# Patient Record
Sex: Male | Born: 1945 | Race: White | Hispanic: No | Marital: Married | Smoking: Former smoker
Health system: Southern US, Community
[De-identification: ages and names within clinical notes are randomized; demographics above are authoritative.]

## PROBLEM LIST (undated history)

## (undated) DIAGNOSIS — C649 Malignant neoplasm of unspecified kidney, except renal pelvis: Secondary | ICD-10-CM

## (undated) DIAGNOSIS — C61 Malignant neoplasm of prostate: Secondary | ICD-10-CM

## (undated) DIAGNOSIS — N400 Enlarged prostate without lower urinary tract symptoms: Secondary | ICD-10-CM

## (undated) HISTORY — PX: ELBOW SURGERY: SHX618

## (undated) HISTORY — PX: PELVIC FLOOR REPAIR: SHX2192

## (undated) HISTORY — PX: NEPHRECTOMY: SHX65

## (undated) HISTORY — PX: SPLENECTOMY, TOTAL: SHX788

---

## 2002-11-01 ENCOUNTER — Ambulatory Visit (HOSPITAL_COMMUNITY): Admission: RE | Admit: 2002-11-01 | Discharge: 2002-11-01 | Payer: Self-pay | Admitting: Orthopedic Surgery

## 2002-11-01 ENCOUNTER — Encounter: Payer: Self-pay | Admitting: Orthopedic Surgery

## 2004-01-20 ENCOUNTER — Ambulatory Visit (HOSPITAL_COMMUNITY): Admission: RE | Admit: 2004-01-20 | Discharge: 2004-01-20 | Payer: Self-pay | Admitting: Gastroenterology

## 2004-07-05 HISTORY — PX: NEPHRECTOMY RADICAL: SUR878

## 2004-11-02 ENCOUNTER — Ambulatory Visit (HOSPITAL_COMMUNITY): Admission: RE | Admit: 2004-11-02 | Discharge: 2004-11-02 | Payer: Self-pay | Admitting: Urology

## 2004-11-05 ENCOUNTER — Ambulatory Visit (HOSPITAL_COMMUNITY): Admission: RE | Admit: 2004-11-05 | Discharge: 2004-11-05 | Payer: Self-pay | Admitting: Urology

## 2004-11-19 ENCOUNTER — Ambulatory Visit (HOSPITAL_COMMUNITY): Admission: RE | Admit: 2004-11-19 | Discharge: 2004-11-19 | Payer: Self-pay | Admitting: Urology

## 2004-11-19 ENCOUNTER — Ambulatory Visit (HOSPITAL_BASED_OUTPATIENT_CLINIC_OR_DEPARTMENT_OTHER): Admission: RE | Admit: 2004-11-19 | Discharge: 2004-11-19 | Payer: Self-pay | Admitting: Urology

## 2004-11-26 ENCOUNTER — Inpatient Hospital Stay (HOSPITAL_COMMUNITY): Admission: RE | Admit: 2004-11-26 | Discharge: 2004-11-28 | Payer: Self-pay | Admitting: Urology

## 2004-12-03 ENCOUNTER — Inpatient Hospital Stay (HOSPITAL_COMMUNITY): Admission: EM | Admit: 2004-12-03 | Discharge: 2004-12-07 | Payer: Self-pay | Admitting: Emergency Medicine

## 2005-03-15 ENCOUNTER — Encounter: Admission: RE | Admit: 2005-03-15 | Discharge: 2005-03-15 | Payer: Self-pay | Admitting: Family Medicine

## 2005-05-11 ENCOUNTER — Ambulatory Visit (HOSPITAL_COMMUNITY): Admission: RE | Admit: 2005-05-11 | Discharge: 2005-05-11 | Payer: Self-pay | Admitting: Urology

## 2005-06-15 ENCOUNTER — Encounter: Payer: Self-pay | Admitting: Emergency Medicine

## 2005-06-16 ENCOUNTER — Inpatient Hospital Stay (HOSPITAL_COMMUNITY): Admission: EM | Admit: 2005-06-16 | Discharge: 2005-06-23 | Payer: Self-pay | Admitting: Emergency Medicine

## 2005-11-10 ENCOUNTER — Ambulatory Visit (HOSPITAL_COMMUNITY): Admission: RE | Admit: 2005-11-10 | Discharge: 2005-11-10 | Payer: Self-pay | Admitting: Urology

## 2006-02-15 IMAGING — CR DG PELVIS 1-2V
2 series · 2 of 2 positions shown · non-contrast
Comparison: 3224 hours.

CLINICAL DATA: Symphysis pubis disruption.
PELVIS ? 2 VIEW:

[view not recorded (1 of 2)]
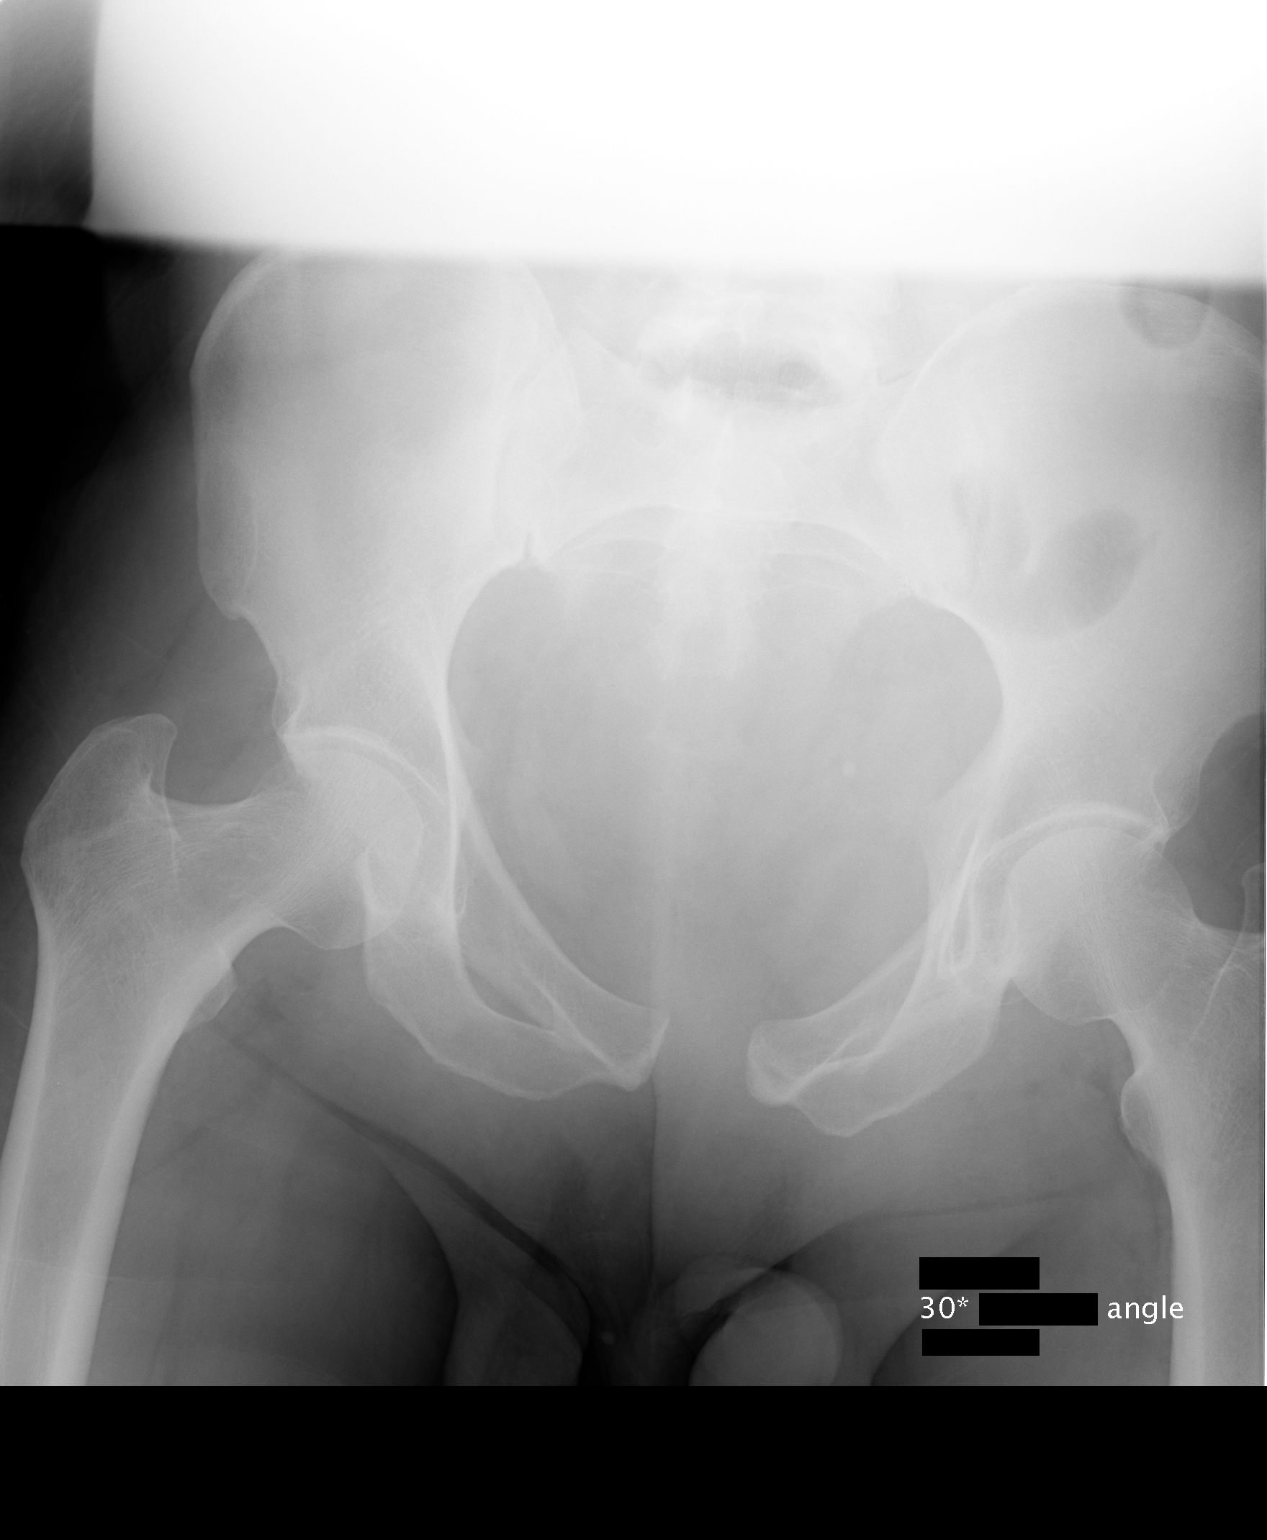

[view not recorded (2 of 2)]
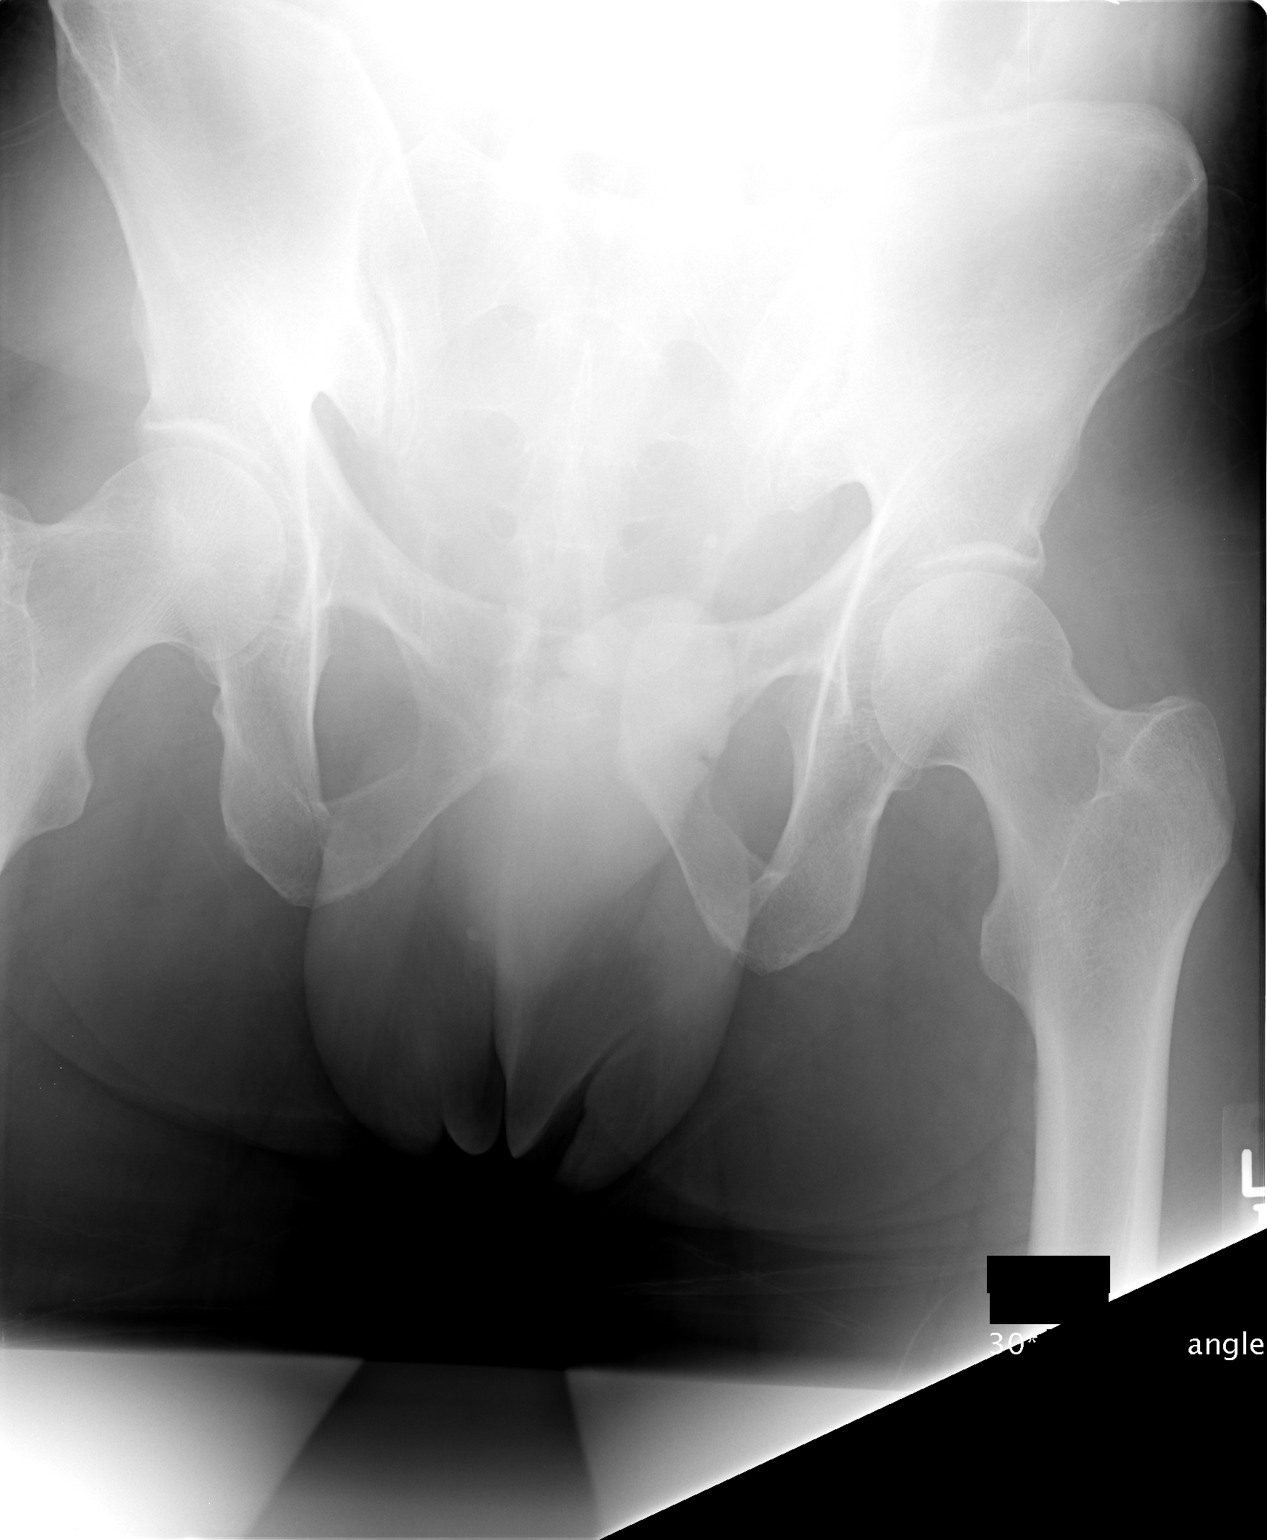

[2 of 2 positions shown; findings below may reference images not displayed]

FINDINGS: With portable apparatus, inlet and outlet views were obtained.
FINDINGS: Diastasis of the symphysis pubis appears slightly less prominent, but this may be due to differences in technique.  It measures approximately 24 to 25 mm in both projections. I do not see any definite fractures. The SI joints appear normal.
IMPRESSION: 1.  Diastasis of the symphysis pubis as described above.
2.  No other definite fractures.
3.  SI joints appear intact.

## 2006-02-17 IMAGING — RF DG PELVIS 1-2V
1 series · 3 of 3 positions shown · non-contrast
Comparison: 06/16/05.

CLINICAL DATA: 59 year old with pubic symphysis disruption.  ORIF.
PELVIS ? 3 VIEW ? 06/18/05:

[Series 1: run · 3 of 3 slices shown]
[im 1/3]
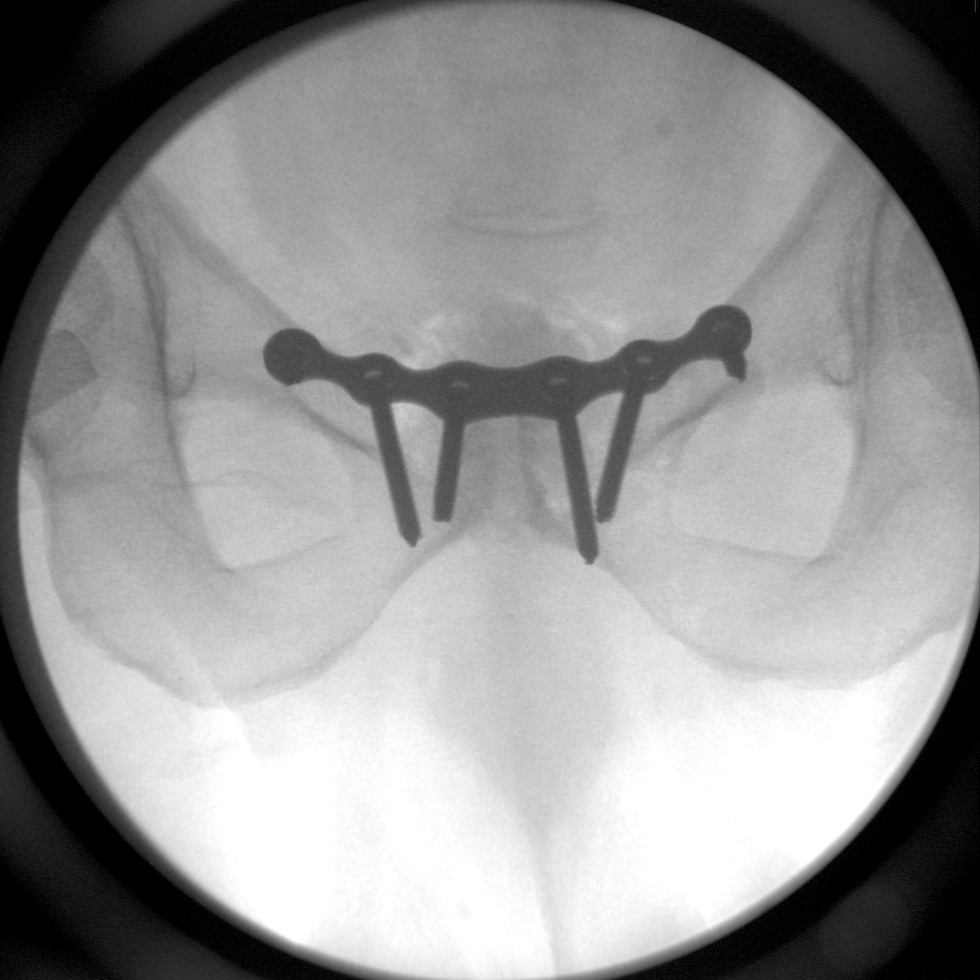
[im 2/3]
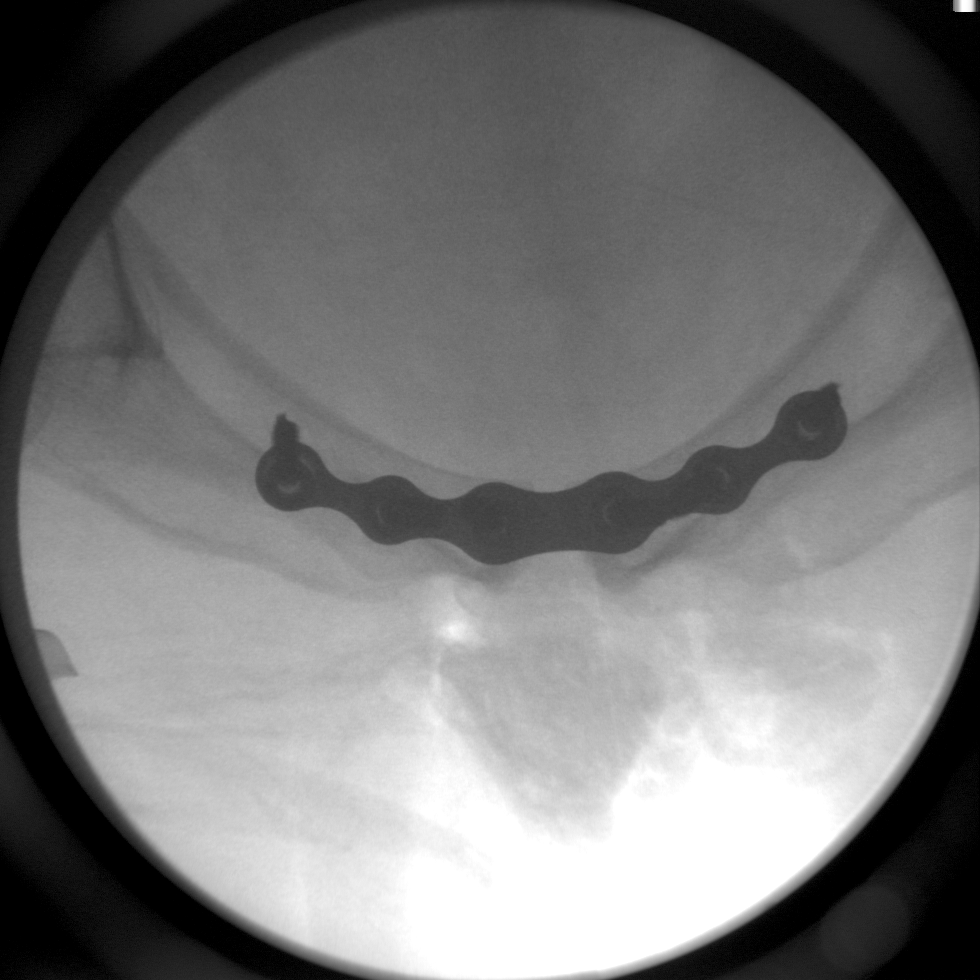
[im 3/3]
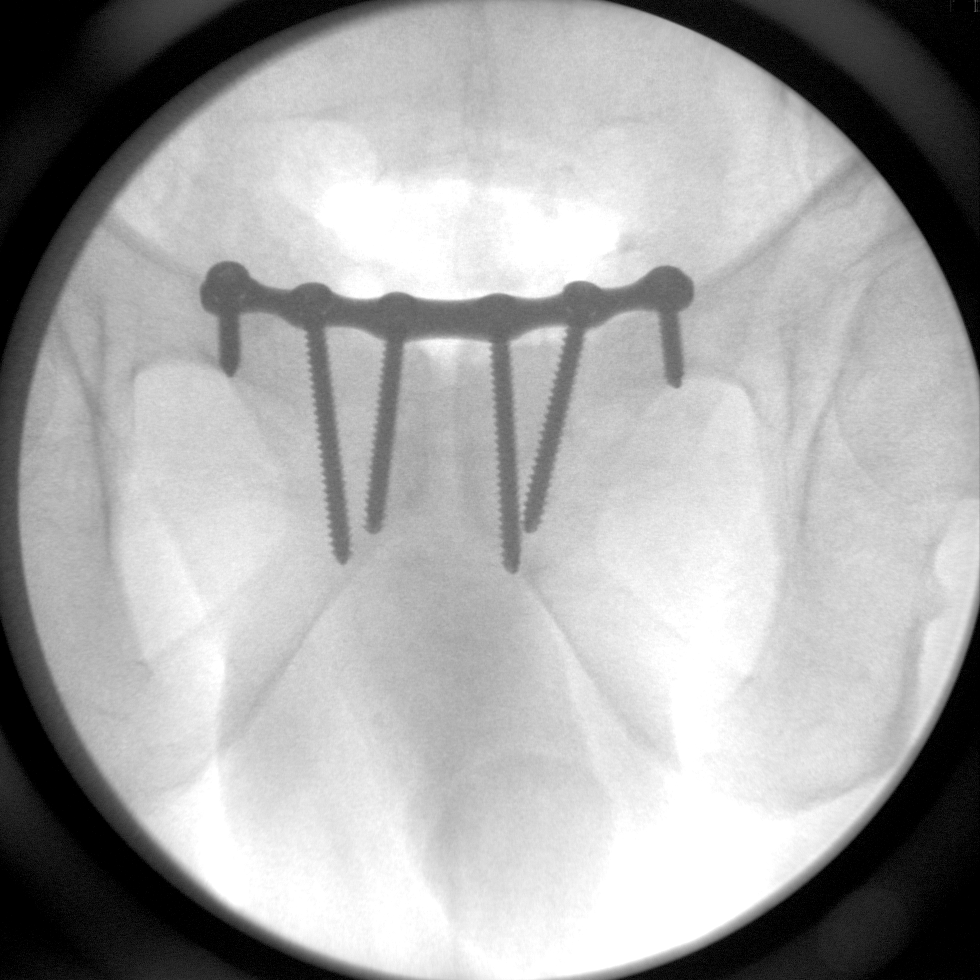

[3 of 3 positions shown; findings below may reference images not displayed]

FINDINGS: Three intraoperative images are submitted, showing interval reduction of symphysis pubis diastasis.  There is a screw-plate traversing the superior aspect of the pubic symphysis with alignment near anatomic.
IMPRESSION: ORIF of symphyseal diastasis.

## 2006-02-18 IMAGING — CR DG PELVIS 3+V JUDET
3 series · 3 of 3 positions shown · non-contrast
Comparison: none

CLINICAL DATA: Pubic symphysis disruption.  Follow-up surgery.
 PELVIS ? 3 VIEWS WITH JUDET VIEW ? 06/19/05: 
 The patient has had plate and screw fixation of the symphyseal diastasis.  The plate spans the superior rami and is anchored with three screws on either side.  The components appear well position and there is no radiographically detectable complication.  No fracture is seen elsewhere.

[view not recorded (1 of 3)]
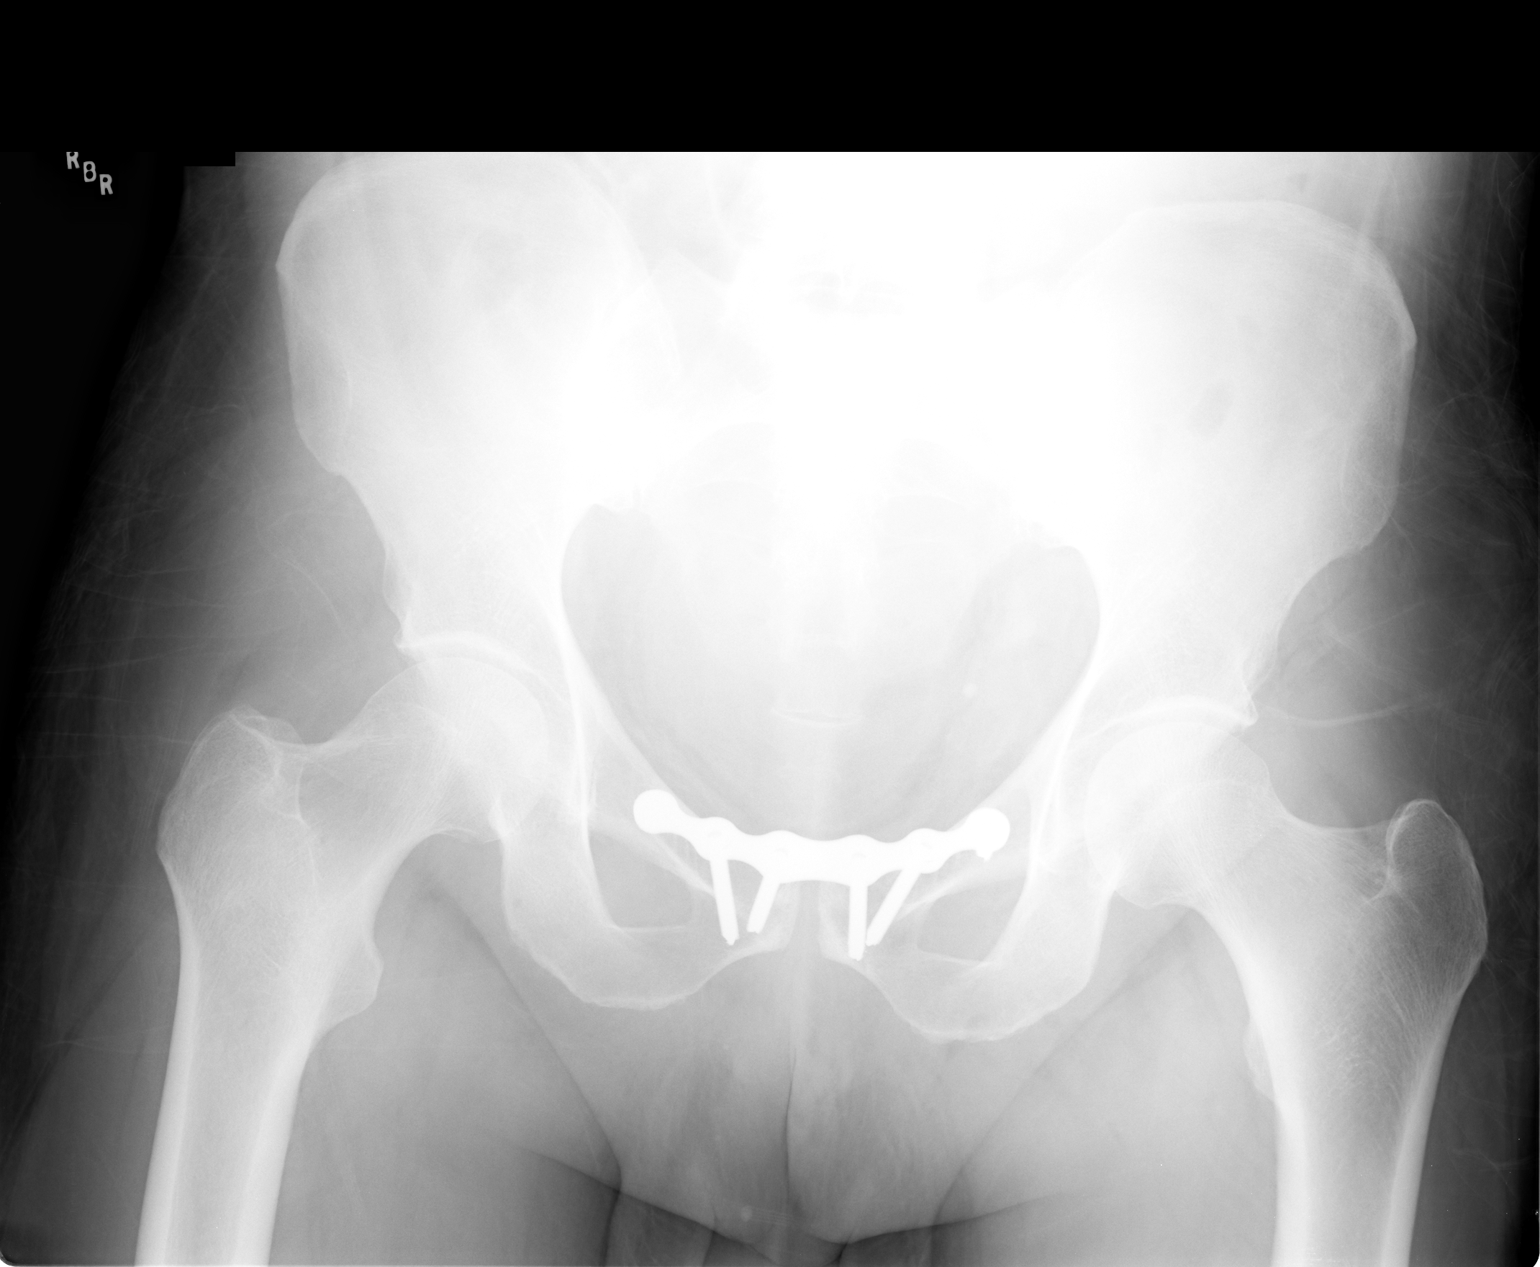

[view not recorded (2 of 3)]
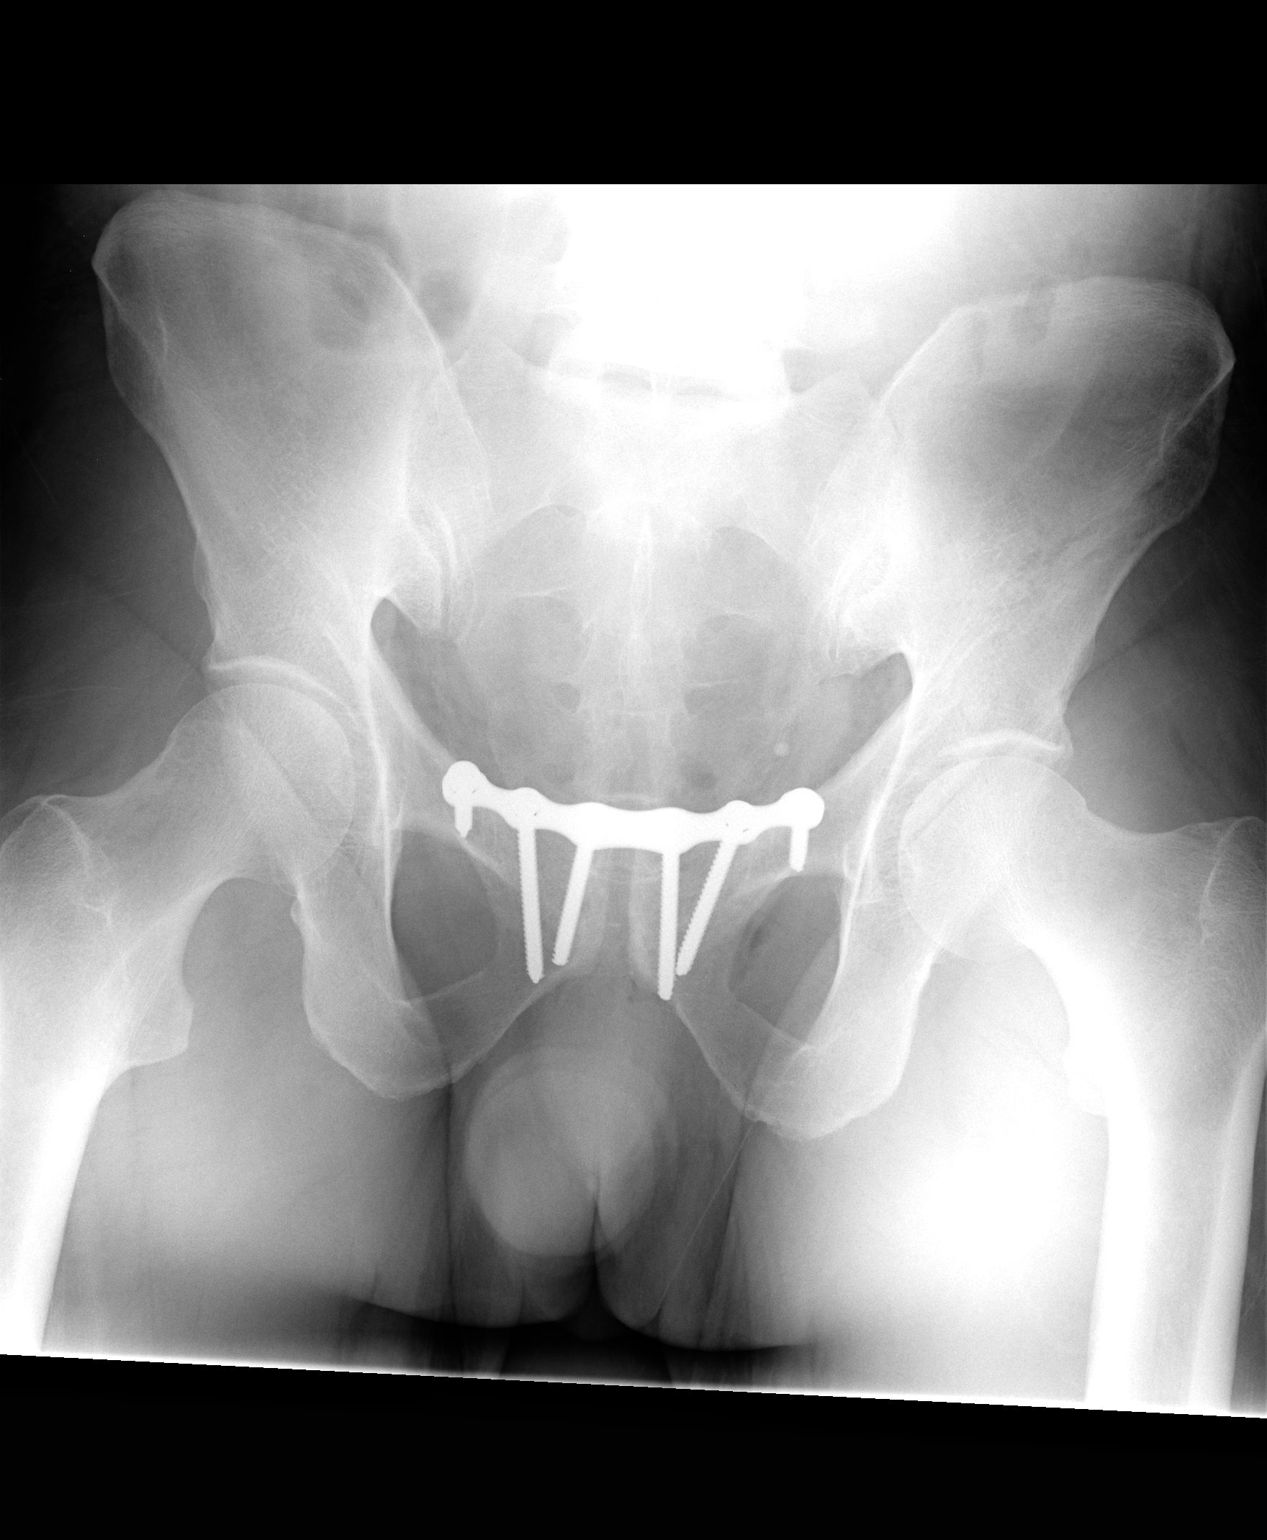

[view not recorded (3 of 3)]
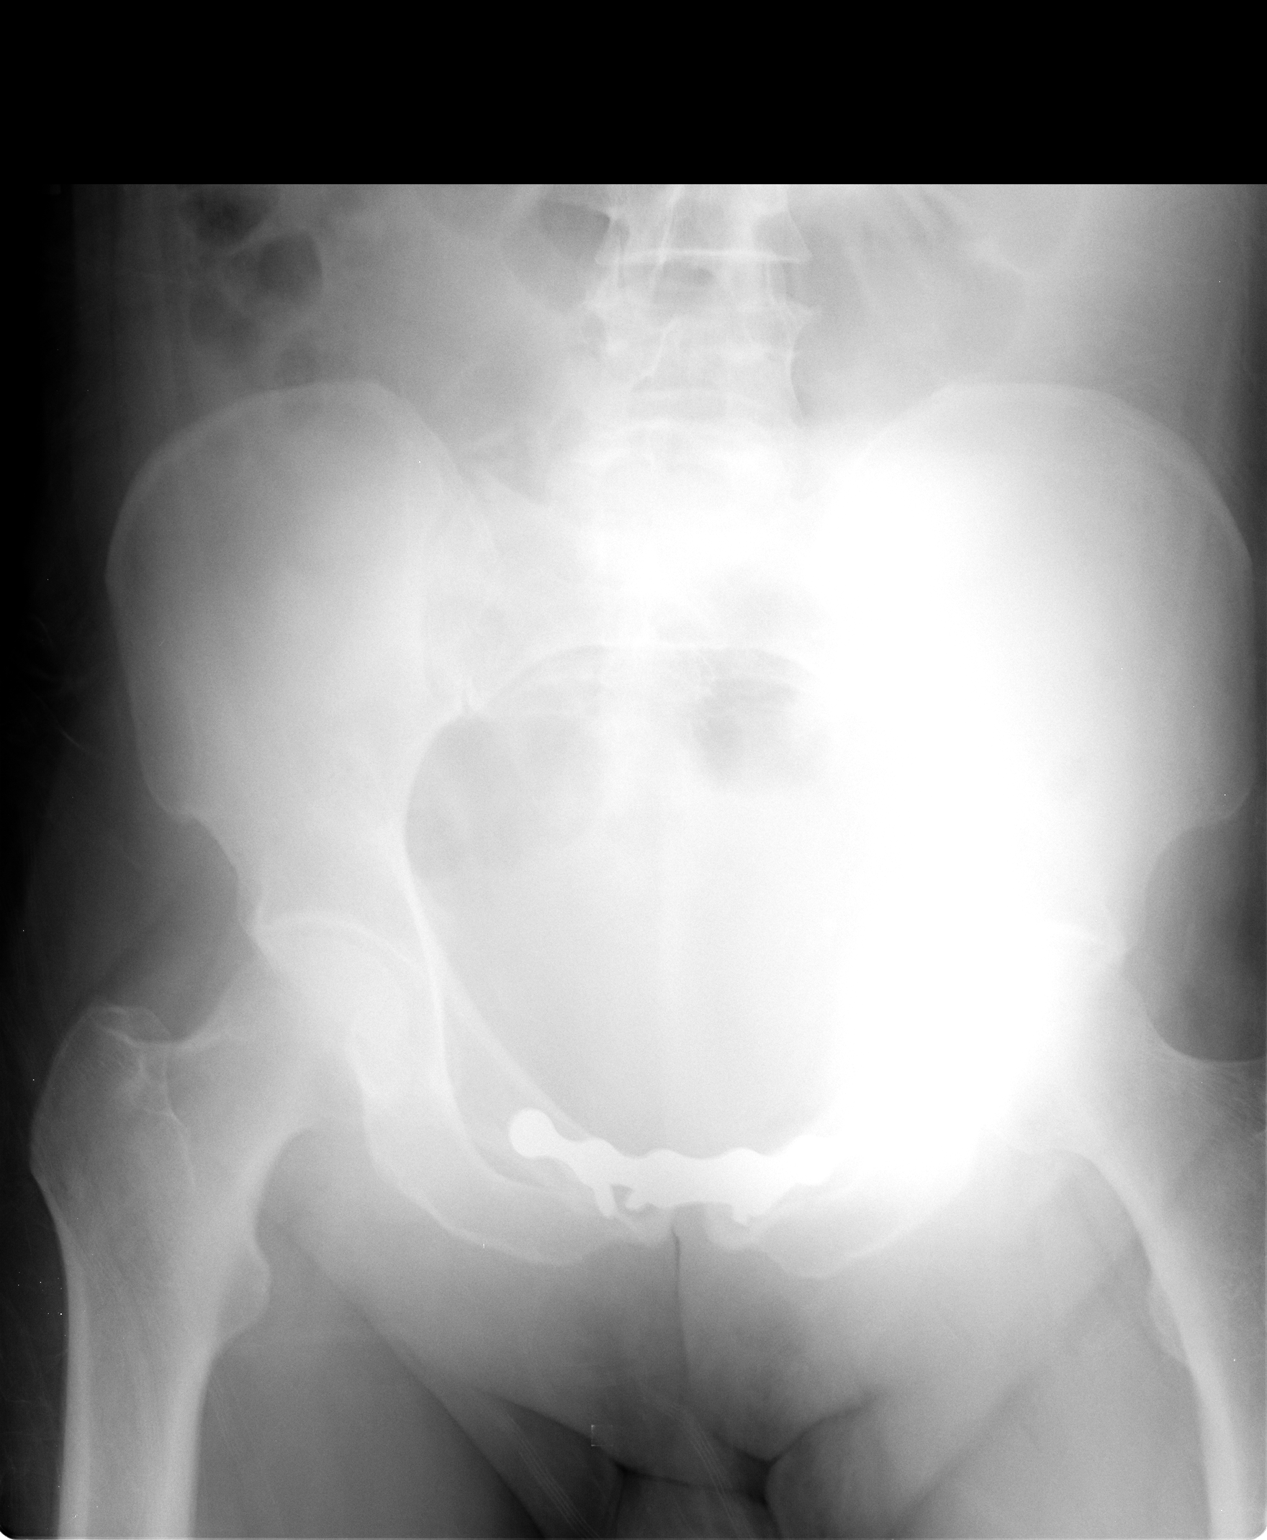

[3 of 3 positions shown; findings below may reference images not displayed]

IMPRESSION: As above.

## 2006-05-11 ENCOUNTER — Ambulatory Visit (HOSPITAL_COMMUNITY): Admission: RE | Admit: 2006-05-11 | Discharge: 2006-05-11 | Payer: Self-pay | Admitting: Urology

## 2006-10-24 ENCOUNTER — Ambulatory Visit (HOSPITAL_COMMUNITY): Admission: RE | Admit: 2006-10-24 | Discharge: 2006-10-24 | Payer: Self-pay | Admitting: Urology

## 2007-05-15 ENCOUNTER — Ambulatory Visit (HOSPITAL_COMMUNITY): Admission: RE | Admit: 2007-05-15 | Discharge: 2007-05-15 | Payer: Self-pay | Admitting: Urology

## 2010-11-20 NOTE — Consult Note (Signed)
NAMEDURAN, OHERN NO.:  [id number]   MEDICAL RECORD NO.:  [id number]          PATIENT TYPE:  INP   LOCATION:  1408                         FACILITY:  [facility]   PHYSICIAN:  Velora Heckler, MD      DATE OF BIRTH:  [birth year]   DATE OF CONSULTATION:  DATE OF DISCHARGE:                                   CONSULTATION   REFERRING PHYSICIAN:  Excell Seltzer. Annabell Howells, M.D.   REASON FOR CONSULTATION:  Abdominal pain one week status post right  nephrectomy.   HISTORY OF PRESENT ILLNESS:  The patient is a 65 year old white male one  week status post right nephrectomy, performed by Dr. Bjorn Pippin at [facility].  The patient had been discharged five days ago.  He  initially did well at home.  He tolerated a regular diet.  He was  ambulating.  He has had good urinary function.  He had no complaints.  The  patient experienced sudden onset of right mid abdominal pain on the evening  of May 31 at approximately 9 p.m.  The pain persisted and became more  severe.  He presented to the emergency department at [facility].  Evaluation showed an elevated white blood cell count of 14,000.  CT scan of  the abdomen and pelvis was obtained.  The patient was admitted by Dr. Annabell Howells  onto the urology service for management.  General surgery is consulted at  this time to help with the assessment of abdominal pain and leukocytosis of  the postoperative patient.   PAST MEDICAL HISTORY:  History of hypercholesterolemia, on Vytorin.   MEDICATIONS:  Vytorin.   ALLERGIES:  1.  PHENERGAN.  2.  COMPAZINE.  3.  ORAL CONTRAST FOR CT SCAN.   SOCIAL HISTORY:  The patient lives in [place].  He works for [employer].  He is accompanied by his wife.  He does not smoke.  He does not  drink alcohol.   FAMILY HISTORY:  Noncontributory.   REVIEW OF SYSTEMS:  The 15-system review discussed with the patient without  significant other positives except as noted above.   PHYSICAL EXAMINATION:  VITAL SIGNS:  Temp 99.2, pulse 98, respirations 20,  blood pressure 122/75.  O2 saturation 96% on room air.  GENERAL:  A 65 year old well-developed and well-nourished white male in no  acute distress.  Ambulating in the hallway on [address] [facility].  HEENT:  Normocephalic and atraumatic.  Sclerae are clear.  Conjunctivae  clear.  Pupils are equal and reactive.  Dentition good.  Moist mucous  membranes.  Voice is normal.  NECK:  Supple without mass.  There is no lymphadenopathy.  LUNGS:  Clear to auscultation bilaterally without rales, rhonchi or wheezes.  HEART:  Regular rate and rhythm without murmur.  Peripheral pulses are full.  ABDOMEN:  Mild distention.  There are active bowel sounds on auscultation.  There is a transverse right abdominal incision which appears clear and dry  without drainage.  There is mild diffuse abdominal tenderness, greater in  the right mid  abdomen and right lower quadrant.  There is tenderness to  percussion, greater on the right than on the left.  There is a suggestion of  rebound tenderness.  There is voluntary guarding.  There is no palpable  mass.  There is no obvious hernia.  EXTREMITIES:  Nontender without edema.  NEUROLOGIC:  Patient is alert and oriented without focal neurological  deficits.   LABORATORY STUDIES:  White count 14.3, hemoglobin 13.6, platelet count  264,000.  Differential shows 87% segs, 11% lymphocytes.  Electrolytes are  relatively normal.  Total bilirubin is normal at 0.9.  Alkaline phosphatase  normal at 90.  SGOT slightly elevated at 53.  SGPT slightly elevated at 75.  Lipase slightly elevated at 52.  Amylase normal at 118.  Urinalysis is  completely benign.   RADIOLOGIC STUDIES:  CT scan of the abdomen and pelvis reviewed at length.  Scan was performed earlier on June 1.  Oral contrast was not administered  due to previous allergic-type reactions.  There are postoperative changes  noted  in the abdomen with both intraperitoneal and retroperitoneal gas  reflecting this recent procedure.  There is mild atelectasis.  There are no  other acute findings except for a small amount of fluid in the pelvis and a  small amount of stranding in the retroperitoneal fat adjacent to the cecum.   IMPRESSION:  Sudden onset of abdominal pain, one week status post right  nephrectomy.  No immediate signs of intra-abdominal complication.   PLAN:  1.  The patient does not appear toxic at present.  I do not feel urgent      surgical intervention is indicated at this point in time; however, the      patient will bear careful observation over the next 24-48 hours.  I      doubt that this represents perforated viscus.  2.  Agree with empiric antibiotic therapy with Unasyn.  3.  Allow ice chips and sips of water as needed.  4.  Check laboratory studies with CBC and differential in a.m. of December 04, 2004.  5.  Will follow closely with you and perform serial abdominal examinations.      TMG/MEDQ  D:  12/03/2004  T:  12/03/2004  Job:  [id number]   cc:   Excell Seltzer. Annabell Howells, M.D.  [address]  [place]  [place] [address]  Fax: [phone]   Velora Heckler, MD  [phone] [address] [place]  [place] [address]

## 2010-11-20 NOTE — Discharge Summary (Signed)
NAMEBENANCIO, Edward NO.:  [id number]   MEDICAL RECORD NO.:  [id number]          PATIENT TYPE:  INP   LOCATION:  5016                         FACILITY:  [facility]   PHYSICIAN:  Cherylynn Ridges, M.D.    DATE OF BIRTH:  [birth year]   DATE OF ADMISSION:  06/15/2005  DATE OF DISCHARGE:  06/23/2005                                 DISCHARGE SUMMARY   ADMITTING TRAUMA SURGEON:  Gabrielle Dare. Janee Morn, M.D.   ORTHOPEDIC CONSULTANT:  Ollen Gross, M.D. and Doralee Albino. Carola Frost, M.D.   DISCHARGE DIAGNOSES:  1.  Thrown from a horse.  2.  Pubic diastasis.  3.  Acute blood loss anemia.  4.  Ileus, resolved.   PROCEDURES:  ORIF of anterior pelvic ring/synthesis pubis per Dr. Carola Frost on  June 18, 2005.   HISTORY OF PRESENT ILLNESS:  This is a 65 year old white male who was riding  a horse, trying to break it, when it bucked violently and he was thrown in  the saddle and may have struck his suprapubic area on the saddle horn. He  complained of suprapubic pain and low back pain. He was seen initially at  [facility] where a CT scan there demonstrated pubic symphysis  diastasis and extraperitoneal hematoma. The patient was transferred to  trauma service. Review of the CT scan of his abdomen and pelvis showed  diastasis of his pubic symphysis and a large preperitoneal hematoma with no  active extravasation. The patient was maintained initially on bedrest. He  was seen by Dr. Lequita Halt in consultation and then referred to Dr. Carola Frost. It  was felt that the patient would benefit from ORIF of his anterior pelvic  ring. The patient was taken to the OR on June 18, 2005 for this  procedure and did well. Perioperatively, he did have some difficulty with  ileus, but this has since resolved with laxatives and he is tolerating a  regular diet by the time of discharge. Postoperatively, the patient was  maintained on a brief course of Lovenox and is being discharged on aspirin.  He is  mobile, ambulatory with a rolling walker.   The patient is deemed medically stable at this time for discharge.   DISCHARGE MEDICATIONS:  1.  Vicodin 1 p.o. q.2-3h. p.r.n. pain.  2.  Ferrous sulfate 325 mg 1 p.o. t.i.d.  3.  Senokot as needed.  4.  Colace as needed.  5.  Enteric-coated aspirin 325 mg q.d.   FOLLOW UP:  The patient is to follow up with Dr. Carola Frost in 10 days with  trauma service as needed.   ACTIVITY/DIET:  The patient will continue to ambulate with a walker. Diet is  regular.      Shawn Rayburn, P.A.      Cherylynn Ridges, M.D.  Electronically Signed    SR/MEDQ  D:  06/23/2005  T:  06/24/2005  Job:  [id number]   cc:   Doralee Albino. Carola Frost, M.D.  Fax: [phone]   [facility]

## 2010-11-20 NOTE — Discharge Summary (Signed)
Edward Guzman, Edward Guzman               ACCOUNT NO.:  [id number]   MEDICAL RECORD NO.:  [id number]          PATIENT TYPE:  INP   LOCATION:  1408                         FACILITY:  [facility]   PHYSICIAN:  Excell Seltzer. Annabell Howells, M.D.    DATE OF BIRTH:  July 25, 1945   DATE OF ADMISSION:  12/03/2004  DATE OF DISCHARGE:  12/07/2004                                 DISCHARGE SUMMARY   Briefly, Edward Guzman is a 65 year old white male who was admitted one week  after a right nephrectomy, for abdominal pain of sudden onset, on 05/31.  He  was seen in the emergency room, where a white count was elevated at 14 and  CT of the abdomen and pelvis was nonspecific.  However, due to the severity  of his pain, he was admitted.  For additional details, please see the  history and physical on the chart.   HOSPITAL COURSE:  On the day of admission, his liver function tests, lipase  and amylase were checked.  He was started empirically on Unasyn 3 grams IV  every 6 hours.  A Dulcolax suppository was obtained.  I did obtain a general  surgery consultation with Dr. Darnell Level, who felt that a perforated viscus  was unlikely and agreed with observation.  Patient was placed on ice chips  and sips of water.  On the second hospital day, the patient continued to  have pain.  Temp was 99.1.  His abdomen remained tender but somewhat less.  The pain was greatest in the right lower quadrant.  Bowel sounds were  positive.  White count was down to 11.  His LFTs were stable, with a slight  elevation that had been present prior to his surgery.  His amylase was 104,  lipase was slightly elevated at 54.  An abdominal series was obtained which  revealed a  small amount of free air consistent with the recent surgery and  a single dilated small bowel loop, suggestive of an isolated ileus.  He was  continued on his Unasyn and given Dulcolax p.r.n.  On June 3, the third  hospital day, he remained fairly stable.  T-max was 100.6.  Abdomen was  soft  but still somewhat tender.  The course of observation was continued.  On  June 4, T-max was 100, but he felt much better.  His belly was soft, with  mild diffuse tenderness which had improved.  Chest x-ray revealed decreased  free air.  His diet was advanced.  On June 5, the patient continued to  improve.  He has had bowel movements.  He was afebrile.  Vital signs were  stable.  His abdomen was soft, flat and nontender.  His wound was intact.  He was felt to be ready for discharge home, with a diagnosis of abdominal  pain, multiple sites but greatest in the right lower quadrant,  possible  ileus, with recent right nephrectomy for renal cell carcinoma.  There were  no complications during his admission.   His discharge medications include Vicodin and Bactrim.  He was instructed to  follow up with me in  one to two weeks.  His disposition is to home.  His  prognosis good.  His condition is improved.       JJW/MEDQ  D:  12/29/2004  T:  12/29/2004  Job:  [id number]   cc:   Velora Heckler, MD  [address]  [place]  [place] [address]   Excell Seltzer. Annabell Howells, M.D.  [address]  [place]  [place] [address]  Fax: [phone]

## 2010-11-20 NOTE — Op Note (Signed)
NAMEANUAR, WALGREN               ACCOUNT NO.:  [id number]   MEDICAL RECORD NO.:  [id number]          PATIENT TYPE:  INP   LOCATION:  5016                         FACILITY:  [facility]   PHYSICIAN:  Doralee Albino. Carola Frost, M.D. DATE OF BIRTH:  1946-05-29   DATE OF PROCEDURE:  06/18/2005  DATE OF DISCHARGE:                                 OPERATIVE REPORT   PREOPERATIVE DIAGNOSIS:  Anterior pelvic ring disruption/symphysis pubis  fracture.   POSTOPERATIVE DIAGNOSIS:  Anterior pelvic ring disruption/symphysis pubis  fracture.   PROCEDURE:  ORIF of anterior pelvic ring/symphysis pubis.   SURGEON:  Doralee Albino. Carola Frost, M.D.   ASSISTANT:  Cecil Cranker, PA   ANESTHESIA:  General.   COMPLICATIONS:  None.   ESTIMATED BLOOD LOSS:  Less than 100 cc of acute blood loss.   DRAINS:  None.   SPECIMENS:  None.   DISPOSITION:  To PACU.   CONDITION:  Stable.   BRIEF SUMMARY OF INDICATIONS FOR PROCEDURE:  Edward Guzman is an 65 year old  male who sustained a severe anterior pelvic ring injury during a horse  breaking episode. He was not actually thrown from the horse, but was unable  to bear weight afterward, and complained of severe pubic pain. He developed  some significant swelling and ecchymosis as well. Plain x-ray and CT scan  confirmed disruption of the anterior pubic symphysis by over 2.8 cm.  There  was no injury to the posterior ligaments of the SI joint. After discussion  of the risks and benefits of surgery, including the possibility of urologic  injury, infection, nerve or vessel injury, failure of fixation and  thromboembolic risk; the patient wished to proceed with the recommended  internal fixation of his pelvic ring.   DESCRIPTION OF PROCEDURE:  Edward Guzman was taken to the operating room 2  days after injury.  He was placed supine on the radiolucent table. After  induction of general anesthesia and placement of a Foley catheter to  decompress the bladder, his pubic  area was prepped and draped in the usual  sterile fashion after a shave. He had extensive ecchymosis of his scrotal  area and enormous amount of swelling as well.  The blue area extended down  the inner aspects of his thighs as well. There was no blood noted in the  Foley pre and postoperatively. An 8 cm incision was made transversely at the  superior edge, about 1 cm above the edge of the symphysis pubis. Dissection  was carried carefully down to the subcutaneous tissue and then the fascia of  the abdominal muscles. The pyramidalis was identified and split 1 cm from  insertion, to allow for adequate tissue for repair.  It was tagged after  partially releasing it. We were also careful to watch for and protect the  bladder at all times, using initially a finger through a small rent and then  the malleable retractor. The rectus had been traumatically avulsed from the  left side of the pubis. After dividing the muscles we were then able to  clearly see the complete disruption of the pubic symphysis.  The disk  remained attached to the right side. There was complete instability  anteriorly. There was a large amount of hematoma, which was evacuated with  suction and lavage.  The bladder did not appear to be injured in any way,  and again was protected with the malleable.  A Cobb and a Key elevator were  used to carefully elevate soft tissues along the brim, on both the right and  left sides. We then placed 2 screws into the anterior aspect of the pelvis,  and used the Jung-Bluth clamp to reduce and compress the anterior pubic  symphysis. We then took a 6-hole plate from the Mod-A-Set, using the Stouder  plate option and contour the ends to fit nicely along the brim. We then  placed the middle screw on each side, leaving the second in compression  mode.  We then were able to remove the Jung-Bluth clamp and anterior screws  into place and the 2 center screws down the anterior aspect of the pubis.   These were all bicortical, as were the remaining 2 screws in the lateral  aspects of the plate.  AP inlet and outlet views of the pelvis were then  obtained, showing appropriate screw placement and length, as well as  reduction of the pelvic ring. The wound was copiously irrigated, and as  there was no bleeding of significance whatsoever, we did not place a deep  drain.  The abdominal muscles were repaired at their insertion with multiple  interrupted figure-of-eight #1 Vicryl.  The deep tissues were closed with 0  Vicryl,  and the skin with a running Prolene and Steri-Strips. We were  careful to avoid the spermatic cord during the closure, and also to make  sure that the closure was solid at the edges to reduce the chance of  subsequent herniation. The patient was then taken to the PACU, after  application of a sterile dressing and awakened from anesthesia.   PROGNOSIS:  Edward Guzman prognosis with this injury is good, given the  restoration of stability to his pelvic ring.  He will be allowed  weightbearing as tolerated with the use of crutches or walker.  At this time  he has no known neurologic deficits, but there is the possibility of sexual  dysfunction in this pattern of injury. At this time, there is no suspicion  of urologic injury. He will be on DVT prophylaxis, but this can be weaned  once he is adequately mobile. I will plan to see him back in the clinic in  about 10 days for removal of the sutures.      Doralee Albino. Carola Frost, M.D.  Electronically Signed     MHH/MEDQ  D:  06/20/2005  T:  06/22/2005  Job:  [id number]

## 2010-11-20 NOTE — H&P (Signed)
NAMERONY, RATZ NO.:  [id number]   MEDICAL RECORD NO.:  [id number]          PATIENT TYPE:  INP   LOCATION:  2627                         FACILITY:  [facility]   PHYSICIAN:  Gabrielle Dare. Janee Morn, M.D.DATE OF BIRTH:  08-23-1945   DATE OF ADMISSION:  06/15/2005  DATE OF DISCHARGE:                                HISTORY & PHYSICAL   CHIEF COMPLAINT:  Suprapubic pain.   HISTORY OF PRESENT ILLNESS:  The patient is a 65 year old white male who was  riding a horse, trying to break it.  The horse bucked violently and he kind  of was thrown about in the saddle with some question of striking his  suprapubic area on a portion of the saddle.  He complained of suprapubic  pain and lower back pain.  He was initially seen at [facility].  A  CT scan there demonstrated some pubic symphysis diastasis and some extra-  peritoneal hematoma.  He was transferred to [facility] for  admission to the trauma service.   PAST MEDICAL HISTORY:  1.  Hypercholesterolemia.  2.  Renal cell carcinoma.   PAST SURGICAL HISTORY:  Right nephrectomy for the renal cell carcinoma  earlier this year.   SOCIAL HISTORY:  He does not smoke, or drink alcohol.   CURRENT MEDICATIONS:  None.   ALLERGIES:  COMPAZINE.   REVIEW OF SYSTEMS:  CONSTITUTIONAL:  Negative.  EENT:  Negative.  CARDIOVASCULAR:  Negative.  PULMONARY:  Negative.  GI:  Negative.  GENITOURINARY:  He is passing his urine without difficulty.   PHYSICAL EXAMINATION:  VITAL SIGNS:  Pulse 100, blood pressure 119/83,  respirations 16, temperature 99.3 degrees.  Saturation is 98%.  SKIN:  Warm.  HEENT:  Face is atraumatic.  Pupils 2 mm, equal, reactive bilaterally.  Ears  are clear.  NECK:  Supple with no tenderness or swelling.  Trachea in the midline.  LUNGS:  Clear to auscultation bilaterally.  HEART:  Regular, impulse palpable in the left chest.  ABDOMEN:  Some tenderness in the suprapubic and bilateral  pelvic areas and  anteriorly.  There is no guarding or peritonitis.  GENITOURINARY:  Has no meatal blood.  PELVIS:  Tender on palpation anteriorly.  EXTREMITIES:  He is moving all extremities without gross deformity.  NEUROLOGIC:  Glasgow Coma Scale is 15.  Sensation and motor exam:  Intact in  the upper and lower extremities, but he has significant difficulty elevating  both of his legs, due to pain in the pelvic area.  VASCULAR:  Examination is intact.   LABORATORY DATA:  Liver function tests are normal.  Hemoglobin 14.3,  hematocrit 41.1.  Sodium 135, potassium 3.3, BUN 25.   A CT scan of the abdomen and pelvis shows diastasis of his pubic symphysis  with a large pre-peritoneal hematoma and no active extravasation.   IMPRESSION:  Pubic symphysis diastasis, status post bucking while riding a  horse.   PLAN:  Admit him to the intensive care unit.  Will keep him on bedrest.  We  will obtain an orthopedic consultation.  This was discussed in  person with  Dr. Homero Fellers Aluisio.  In addition, we will check serial CBC's.  If his  hemoglobin continues to drop, and provided he does not develop some evidence  of peritonitis, I feel that the next course of action would be an angiogram  with possible embolization if needed.  The posterior elements of his pelvis,  including bilateral SI joints and sacrum, are intact.  Therefore I feel that  application of a T-bar device would merely be very painful, without much  benefit.  Dr. Lequita Halt did agree with that as well.  We will watch him very  closely.  The plan was discussed in detail with the patient and with his  wife.      Gabrielle Dare Janee Morn, M.D.  Electronically Signed     BET/MEDQ  D:  06/16/2005  T:  06/16/2005  Job:  [id number]

## 2012-07-05 DIAGNOSIS — N400 Enlarged prostate without lower urinary tract symptoms: Secondary | ICD-10-CM

## 2012-07-05 HISTORY — DX: Benign prostatic hyperplasia without lower urinary tract symptoms: N40.0

## 2018-01-27 ENCOUNTER — Other Ambulatory Visit: Payer: Self-pay | Admitting: Urology

## 2018-01-27 DIAGNOSIS — C61 Malignant neoplasm of prostate: Secondary | ICD-10-CM

## 2018-02-08 ENCOUNTER — Ambulatory Visit (HOSPITAL_COMMUNITY)
Admission: RE | Admit: 2018-02-08 | Discharge: 2018-02-08 | Disposition: A | Discharge status: Home / Self Care | Payer: Medicare Other | Source: Ambulatory Visit | Attending: Urology | Admitting: Urology

## 2018-02-08 ENCOUNTER — Encounter (HOSPITAL_COMMUNITY)
Admission: RE | Admit: 2018-02-08 | Discharge: 2018-02-08 | Disposition: A | Discharge status: Home / Self Care | Payer: Medicare Other | Source: Ambulatory Visit | Attending: Urology | Admitting: Urology

## 2018-02-08 DIAGNOSIS — C61 Malignant neoplasm of prostate: Secondary | ICD-10-CM | POA: Diagnosis not present

## 2018-02-08 MED ORDER — TECHNETIUM TC 99M MEDRONATE IV KIT
22.0000 | PACK | Freq: Once | INTRAVENOUS | Status: AC | PRN
  Administered 2018-02-08: 22 via INTRAVENOUS

## 2018-02-20 ENCOUNTER — Telehealth: Payer: Self-pay | Admitting: Medical Oncology

## 2018-02-20 NOTE — Telephone Encounter (Signed)
Left message requesting a return call to discuss referral to the [facility]. I did speak with wife to verify home address. She informed me patient has gone to see his son who is in ICU.

## 2018-02-20 NOTE — Telephone Encounter (Signed)
Spoke with patient to introduce myself as the Prostate Nurse Navigator and the Coordinator of the [facility].  1. I confirmed with the patient he is aware of his referral to the clinic 02/28/18 arriving at 12:30 pm.  2. I discussed the format of the clinic and the physicians he will be seeing that day. I reviewed the location, valet parking, registration and asked him to have lunch due to length of clinic.  3. I discussed where the clinic is located and how to contact me.  4. I confirmed his address and informed him I would be mailing a packet of information and forms to be completed. I asked him to bring them with him the day of his appointment.   He voiced understanding of the above. I asked him to call me if he has any questions or concerns regarding his appointments or the forms he needs to complete.

## 2018-02-23 ENCOUNTER — Encounter: Payer: Self-pay | Admitting: Medical Oncology

## 2018-02-27 ENCOUNTER — Telehealth: Payer: Self-pay | Admitting: Medical Oncology

## 2018-02-27 ENCOUNTER — Encounter: Payer: Self-pay | Admitting: Medical Oncology

## 2018-02-27 NOTE — Telephone Encounter (Signed)
Spoke with Edward Guzman to confirm appointment for [facility] 03/01/18 arriving at 12:30 pm. I reminded him to bring his completed medical forms and to have lunch prior to arrival due to the length of the clinic. He voiced understanding.

## 2018-02-28 ENCOUNTER — Inpatient Hospital Stay: Payer: Medicare Other | Attending: Oncology | Admitting: Oncology

## 2018-02-28 ENCOUNTER — Other Ambulatory Visit: Payer: Self-pay

## 2018-02-28 ENCOUNTER — Encounter: Payer: Self-pay | Admitting: Radiation Oncology

## 2018-02-28 ENCOUNTER — Encounter: Payer: Self-pay | Admitting: Medical Oncology

## 2018-02-28 ENCOUNTER — Ambulatory Visit
Admission: RE | Admit: 2018-02-28 | Discharge: 2018-02-28 | Disposition: A | Discharge status: Home / Self Care | Payer: Medicare Other | Source: Ambulatory Visit | Attending: Radiation Oncology | Admitting: Radiation Oncology

## 2018-02-28 DIAGNOSIS — C61 Malignant neoplasm of prostate: Secondary | ICD-10-CM

## 2018-02-28 DIAGNOSIS — Z905 Acquired absence of kidney: Secondary | ICD-10-CM | POA: Diagnosis not present

## 2018-02-28 DIAGNOSIS — Z85528 Personal history of other malignant neoplasm of kidney: Secondary | ICD-10-CM

## 2018-02-28 DIAGNOSIS — Z87891 Personal history of nicotine dependence: Secondary | ICD-10-CM

## 2018-02-28 DIAGNOSIS — N400 Enlarged prostate without lower urinary tract symptoms: Secondary | ICD-10-CM | POA: Insufficient documentation

## 2018-02-28 HISTORY — DX: Malignant neoplasm of unspecified kidney, except renal pelvis: C64.9

## 2018-02-28 HISTORY — DX: Malignant neoplasm of prostate: C61

## 2018-02-28 HISTORY — DX: Benign prostatic hyperplasia without lower urinary tract symptoms: N40.0

## 2018-02-28 NOTE — Progress Notes (Signed)
Radiation Oncology         [phone] ________________________________  Multidisciplinary Prostate Cancer Clinic  Initial Radiation Oncology Consultation  Name: Edward Guzman MRN: [id number]  Date: 02/28/2018  DOB: 1946/01/21  VZ:SMOLMBE, No Pcp Per  Raynelle Bring, MD   REFERRING PHYSICIAN: Raynelle Bring, MD  DIAGNOSIS: 72 y.o. gentleman with stage T1c adenocarcinoma of the prostate with a Gleason's score of 4+4 and a PSA of 4.93.    ICD-10-CM   1. Malignant neoplasm of prostate ([place]) C61     HISTORY OF PRESENT ILLNESS::Edward Guzman is a 72 y.o. gentleman.  He is a well established patient of Dr. Jeffie Pollock, having previously been treated with radiacal right nephrectomy in 2006 for [place]. His surgical margins were clear and he remained without evidence of disease recurrance on follow up with Dr. Jeffie Pollock until 2011. At that point, he resumed normal health maintenance with his PCP for routine health screenings and physicals.  More recently, he was noted to have an elevated PSA of 4.5 by his primary care physician, Dr. Natale Milch.  Accordingly, he was referred for evaluation in urology by Dr. Jeffie Pollock on 12/01/17,  digital rectal examination was performed at that time revealing symmetric prostate lobes without nodules.  The patient proceeded to transrectal ultrasound with 12 biopsies of the prostate on 01/23/18.  The prostate volume measured 64.5 grams.  Out of 12 core biopsies, 2 were positive.  The maximum Gleason score was 4+4, and this was seen in right base.  He underwent disease staging studies with CT pelvis and bone scan on 02/08/2018 which were both negative for any evidence of metastatic or osseous metastatic disease.  The patient reviewed the biopsy results with his urologist and he has kindly been referred today to the multidisciplinary prostate cancer clinic for presentation of pathology and radiology studies in our conference for discussion of potential radiation treatment options and  clinical evaluation.  He and his wife live in [place], [place], 3 hours away.   PREVIOUS RADIATION THERAPY: No  PAST MEDICAL HISTORY:  has a past medical history of BPH (benign prostatic hyperplasia) (2014), Prostate cancer ([place]), and Renal cell carcinoma ([place]).    PAST SURGICAL HISTORY: Past Surgical History:  Procedure Laterality Date  . ELBOW SURGERY    . NEPHRECTOMY Right   . NEPHRECTOMY RADICAL  2006   open  . PELVIC FLOOR REPAIR    . SPLENECTOMY, TOTAL      FAMILY HISTORY: family history includes Diabetes in his son.  SOCIAL HISTORY:  reports that he has quit smoking. He has never used smokeless tobacco. He reports that he drinks alcohol. He reports that he does not use drugs.  ALLERGIES: Gastrografin [diatrizoate meglumine & sodium]; Other; and Prochlorperazine edisylate  MEDICATIONS:  No current outpatient medications on file.   No current facility-administered medications for this encounter.     REVIEW OF SYSTEMS:  On review of systems, the patient reports that he is doing well overall. He denies any chest pain, shortness of breath, cough, fevers, chills, night sweats, unintended weight changes. He denies any bowel disturbances, and denies abdominal pain, nausea or vomiting. He denies any new musculoskeletal or joint aches or pains. His IPSS was 11, indicating moderate urinary symptoms. He is able to complete sexual activity with all attempts. He reports urgency and nocturia. A complete review of systems is obtained and is otherwise negative.    PHYSICAL EXAM:  Wt Readings from Last 3 Encounters:  No data found for [employer]  Temp Readings from Last 3 Encounters:  No data found for Temp   BP Readings from Last 3 Encounters:  No data found for BP   Pulse Readings from Last 3 Encounters:  No data found for Pulse    /10  In general this is a well appearing Caucasian man in no acute distress. He is alert and oriented x4 and appropriate throughout the examination. HEENT  reveals that the patient is normocephalic, atraumatic. EOMs are intact. PERRLA. Skin is intact without any evidence of gross lesions. Cardiovascular exam reveals a regular rate and rhythm, no clicks rubs or murmurs are auscultated. Chest is clear to auscultation bilaterally. Lymphatic assessment is performed and does not reveal any adenopathy in the cervical, supraclavicular, axillary, or inguinal chains. Abdomen has active bowel sounds in all quadrants and is intact. The abdomen is soft, non tender, non distended. Lower extremities are negative for pretibial pitting edema, deep calf tenderness, cyanosis or clubbing.  KPS = 100   100 - Normal; no complaints; no evidence of disease. 90   - Able to carry on normal activity; minor signs or symptoms of disease. 80   - Normal activity with effort; some signs or symptoms of disease. 67   - Cares for self; unable to carry on normal activity or to do active work. 60   - Requires occasional assistance, but is able to care for most of his personal needs. 50   - Requires considerable assistance and frequent medical care. 62   - Disabled; requires special care and assistance. 3   - Severely disabled; hospital admission is indicated although death not imminent. 72   - Very sick; hospital admission necessary; active supportive treatment necessary. 10   - Moribund; fatal processes progressing rapidly. 0     - Dead  Karnofsky DA, Abelmann WH, Craver LS and Burchenal JH [phone]) The use of the nitrogen mustards in the palliative treatment of carcinoma: with particular reference to bronchogenic carcinoma Cancer 1 [phone]   LABORATORY DATA:  No results found for: WBC, HGB, HCT, MCV, PLT No results found for: NA, K, CL, CO2 No results found for: ALT, AST, GGT, ALKPHOS, BILITOT   RADIOGRAPHY: Nm Bone Scan Whole Body  Result Date: 02/08/2018 CLINICAL DATA:  Prostate cancer, PSA 4.93 EXAM: NUCLEAR MEDICINE WHOLE BODY BONE SCAN TECHNIQUE: Whole body anterior and  posterior images were obtained approximately 3 hours after intravenous injection of radiopharmaceutical. RADIOPHARMACEUTICALS:  22.0 mCi Technetium-47m MDP IV COMPARISON:  None Correlation: CT pelvis 02/08/2018, CT abdomen pelvis 10/24/2006 FINDINGS: Minimal uptake at shoulders, knees, wrists, RIGHT elbow and RIGHT foot, typically degenerative. Minimal uptake at the anterior pubic bones bilaterally corresponding to plate/screws on CT from prior ORIF. No definite abnormal osseous tracer accumulation identified to suggest osseous metastatic disease. No activity identified within a RIGHT kidney; absent RIGHT kidney by remote CT. Otherwise expected urinary tract and soft tissue distribution of tracer. IMPRESSION: No scintigraphic evidence of osseous metastatic disease. Electronically Signed   By: Lavonia Dana M.D.   On: 02/08/2018 17:52      IMPRESSION/PLAN: 72 y.o. gentleman with a high risk, stage T1c adenocarcinoma of the prostate with a PSA of 4.93 and a Gleason score of 4+4.  We discussed the patient's workup and outlines the nature of prostate cancer in this setting. The patient's T stage, Gleason's score, and PSA put him into the high risk group. Accordingly, he is eligible for a variety of potential treatment options including prostatectomy or ADT in combination with either  8 weeks of external radiation or 5 weeks of external radiation followed by a brachytherapy boost. We discussed the available radiation techniques, and focused on the details and logistics and delivery.  We discussed and outlined the risks, benefits, short and long-term effects associated with radiotherapy and compared and contrasted these with prostatectomy. We discussed the role of SpaceOAR in reducing the rectal toxicity associated with radiotherapy. We also detailed the role of ADT in the treatment of high risk prostate cancer and outlined the associated side effects that could be expected with this therapy. He was encouraged to ask  questions that were answered to his stated satisfaction.  At the end of the conversation the patient is most interested in moving forward with prostatectomy for management of his prostate cancer and we are in support of this decision. We briefly discussed the role of post-operative radiotherapy in the setting of adverse findings on final surgical pathology and/or detectable, rising PSA post-operatively. He appears to have a good understanding of his disease and treatment options.  We will share our findings with Dr. Jeffie Pollock and Dr. Alinda Money so that he can move forward with scheduling surgery in the near future. He knows to call at any time with any questions or concerns related to radiotherapy options.   We spent 60 minutes face to face with the patient and more than 50% of that time was spent in counseling and/or coordination of care.     Nicholos Johns, PA-C    Tyler Pita, MD  [facility] Direct Dial: [phone]  Fax: [phone] [email or web address]  Skype  LinkedIn  This document serves as a record of services personally performed by Tyler Pita, MD and Freeman Caldron, PA-C. It was created on their behalf by Wilburn Mylar, a trained medical scribe. The creation of this record is based on the scribe's personal observations and the provider's statements to them. This document has been checked and approved by the attending provider.

## 2018-02-28 NOTE — Progress Notes (Signed)
GU Location of Tumor / Histology: prostatic adenocarcinoma  If Prostate Cancer, Gleason Score is (4 + 4) and PSA is (4.93) on 01/23/2018. Prostate volume: 64.5 grams.  Edward Guzman had a right radical nephrectomy in 2006. He was sent back for consultation for an enlarged prostate and elevated PSA. His PSA has been slowly rising over the last few years.   11/07/09  PSA  2.18 12/07/07  PSA  1.42 10/25/06 PSA  1.13 11/11/05 PSA  0.84 11/11/04 PSA  1.03   Biopsies of prostate (if applicable) revealed:    Past/Anticipated interventions by urology, if any: prostate biopsy, referral to [facility]  Past/Anticipated interventions by medical oncology, if any: no  Weight changes, if any: no  Bowel/Bladder complaints, if any: Urgency and nocturia   Nausea/Vomiting, if any: no  Pain issues, if any:  none  SAFETY ISSUES:  Prior radiation? no  Pacemaker/ICD? no  Possible current pregnancy? no  Is the patient on methotrexate? no  Current Complaints / other details:  72 year old male. Married.

## 2018-02-28 NOTE — Consult Note (Signed)
[facility]     02/28/2018   --------------------------------------------------------------------------------   Edward Guzman  MRN: [id number]  PRIMARY CARE:  Edward Merles, MD  DOB: [birth year], 72 year old Male  REFERRING:  Edward Seal, MD  SSN: [id number]  PROVIDER:  Irine Guzman, M.D.    TREATING:  Edward Guzman, M.D.    LOCATION:  [facility] [phone]   --------------------------------------------------------------------------------   CC/HPI: CC: Prostate Cancer   Physician requesting consult: Edward Guzman  PCP: Dr. Debe Guzman  Location of consult: [facility] - Prostate Cancer Multidisciplinary Clinic   Edward Guzman is a 72 year old gentleman who was found to have an elevated PSA of 4.5. This prompted a TRUS biopsy of the prostate by Dr. Jeffie Guzman on 01/23/18 that demonstrated Gleason 4+4=8 adenocarcinoma of the prostate with 2 out of 12 biopsy cores positive for maligancy.   Family history: None.   Imaging studies:  CT pelvis (02/08/18): Negative for metastatic disease.  Bone scan (02/08/18): Negative for metastatic disease.   PMH: He has a history of GERD, hyperlipidemia, renal cell carcinoma (pT1a Nx Mx, clear cell), and BPH.  PSH: Right open radical nephrectomy and pelvic repair of a diastasis injury in 2006. He does have hardware that was placed in his pubic symphysis via a low transverse incision.   TNM stage: cT1c N0 M0  PSA: 4.5  Gleason score: 4+4=8  Biopsy (01/23/18): 2/12 cores positive  Left: L lateral mid (5%, 3+3=6)  Right: R base (20%, 4+4=8)  Prostate volume: 64.5 cc   Nomogram  OC disease: 38%  EPE: 60%  SVI: 8%  LNI: 10%  PFS (5 year, 10 year): 62%, 46%   Urinary function: IPSS is 11.  Erectile function: SHIM score is 25. He does take male herbal supplements such as Nugenix or ageless male.     ALLERGIES: Compazine SOLN Contrast Dye Gastrografin SOLN    MEDICATIONS: Tamsulosin Hcl 0.4 mg capsule   Prilosec Otc  Sildenafil 20 mg tablet 1-5 tablets as needed  Vytorin 10 mg-80 mg tablet Oral     GU PSH: Locm 300-399Mg /Ml Iodine,1Ml - 02/08/2018 Prostate Needle Biopsy - 01/23/2018 Radical nephrectomy (open) - about 2006      [place] Notes: Pelvic Repair, Total Splenectomy, Elbow Surgery, Nephrectomy Right   NON-GU PSH: Splenectomy - 2008 Surgical Pathology, Gross And Microscopic Examination For Prostate Needle - 01/23/2018    GU PMH: Elevated PSA - 01/23/2018, He has a progressively rising PSA that is up to 4.5. I am going to have him return for a prostate [place] and biopsy. I reviewed the risks of bleeding, infection and difficulty voiding. , - 12/01/2017 BPH w/LUTS, He is voiding well on tamsulosin. - 12/01/2017 ED due to arterial insufficiency, He was dispensed sildenafil. - 12/01/2017 Urinary Urgency - 12/01/2017 BPH w/o LUTS, Benign prostatic hypertrophy without lower urinary tract symptoms - 2014 Renal cell carcinoma, right      PMH Notes:  1898-07-05 00:00:00 - Note: Normal Routine History And Physical Adult  2008-06-07 13:54:45 - Note: Serum Enzyme Levels - ALT (SGPT) Elevated  2009-11-07 13:52:40 - Note: Kidney Cancer   NON-GU PMH: Personal history of other diseases of the digestive system, History of esophageal reflux - 2014 Personal history of other endocrine, nutritional and metabolic disease, History of hypercholesterolemia - 2014 Hypercholesterolemia    FAMILY HISTORY: Diabetes - Son   SOCIAL HISTORY: Marital Status: Married Preferred Language: English; Race: White Current Smoking Status: Patient does not smoke  anymore.   Tobacco Use Assessment Completed: Used Tobacco in last 30 days? Drinks 4+ caffeinated drinks per day.     Notes: Occupation:, Alcohol Use, Caffeine Use, Marital History - Currently Married, Tobacco Use   REVIEW OF SYSTEMS:    GU Review Male:   Patient denies frequent urination, hard to postpone urination, burning/ pain with urination, get up at night  to urinate, leakage of urine, stream starts and stops, trouble starting your streams, and have to strain to urinate .  Gastrointestinal (Upper):   Patient denies nausea and vomiting.  Gastrointestinal (Lower):   Patient denies diarrhea and constipation.  Constitutional:   Patient denies fever, night sweats, weight loss, and fatigue.  Skin:   Patient denies skin rash/ lesion and itching.  Eyes:   Patient denies blurred vision and double vision.  Ears/ Nose/ Throat:   Patient denies sore throat and sinus problems.  Hematologic/Lymphatic:   Patient denies swollen glands and easy bruising.  Cardiovascular:   Patient denies leg swelling and chest pains.  Respiratory:   Patient denies cough and shortness of breath.  Endocrine:   Patient denies excessive thirst.  Musculoskeletal:   Patient denies back pain and joint pain.  Neurological:   Patient denies headaches and dizziness.  Psychologic:   Patient denies depression and anxiety.   VITAL SIGNS: None   MULTI-SYSTEM PHYSICAL EXAMINATION:    Constitutional: Well-nourished. No physical deformities. Normally developed. Good grooming.  Neck: Neck symmetrical, not swollen. Normal tracheal position.  Respiratory: No labored breathing, no use of accessory muscles. Clear bilaterally.  Cardiovascular: Normal temperature, normal extremity pulses, no swelling, no varicosities. Regular rate and rhythm.  Lymphatic: No enlargement of neck, axillae, groin.  Skin: No paleness, no jaundice, no cyanosis. No lesion, no ulcer, no rash.  Neurologic / Psychiatric: Oriented to time, oriented to place, oriented to person. No depression, no anxiety, no agitation.  Gastrointestinal: No mass, no tenderness, no rigidity, non obese abdomen. He does have a very small transverse incision over his pubis. He has a large right subcostal incision from his prior nephrectomy.  Eyes: Normal conjunctivae. Normal eyelids.  Ears, Nose, Mouth, and Throat: Left ear no scars, no lesions,  no masses. Right ear no scars, no lesions, no masses. Nose no scars, no lesions, no masses. Normal hearing. Normal lips.  Musculoskeletal: Normal gait and station of head and neck.     PAST DATA REVIEWED:  Source Of History:  Patient  Lab Test Review:   PSA  Records Review:   Pathology Reports, Previous Patient Records  Urine Test Review:   Urinalysis  X-Ray Review: C.T. Chest/ABD: Reviewed Films.  Bone Scan: Reviewed Films.     01/23/18 11/07/09 12/07/07 10/25/06 11/11/05 11/11/04  PSA  Total PSA 4.93 ng/mL 2.18  1.42  1.13  0.84  1.03     PROCEDURES: None   ASSESSMENT:      ICD-10 Details  1 GU:   Prostate Cancer - C61    PLAN:           Document Letter(s):  Created for Patient: Clinical Summary         Notes:   1. Prostate cancer: I had a long detailed discussion with Mr. Doubleday and his wife today regarding his prostate cancer diagnosis. Considering the high-risk nature of his cancer, I did recommend proceeding with therapy of curative intent. We discussed the options of both primary surgical therapy and primary radiation therapy with androgen deprivation.   The patient was counseled about the  natural history of prostate cancer and the standard treatment options that are available for prostate cancer. It was explained to him how his age and life expectancy, clinical stage, Gleason score, and PSA affect his prognosis, the decision to proceed with additional staging studies, as well as how that information influences recommended treatment strategies. We discussed the roles for active surveillance, radiation therapy, surgical therapy, androgen deprivation, as well as ablative therapy options for the treatment of prostate cancer as appropriate to his individual cancer situation. We discussed the risks and benefits of these options with regard to their impact on cancer control and also in terms of potential adverse events, complications, and impact on quality of life particularly  related to urinary and sexual function. The patient was encouraged to ask questions throughout the discussion today and all questions were answered to his stated satisfaction. In addition, the patient was provided with and/or directed to appropriate resources and literature for further education about prostate cancer and treatment options.   We discussed surgical therapy for prostate cancer including the different available surgical approaches. We discussed, in detail, the risks and expectations of surgery with regard to cancer control, urinary control, and erectile function as well as the expected postoperative recovery process. Additional risks of surgery including but not limited to bleeding, infection, hernia formation, nerve damage, lymphocele formation, bowel/rectal injury potentially necessitating colostomy, damage to the urinary tract resulting in urine leakage, urethral stricture, and the cardiopulmonary risks such as myocardial infarction, stroke, death, venothromboembolism, etc. were explained. The risk of open surgical conversion for robotic/laparoscopic prostatectomy was also discussed.    He appears inclined to proceed with surgical treatment and would like to avoid androgen deprivation. My tentative plan would be to perform a bilateral nerve-sparing robot assisted laparoscopic radical prostatectomy and pelvic lymphadenectomy. We did discuss the potential increased risk of having intra-abdominal adhesions considering his prior abdominal surgery and the potential complex the of surgery considering his prior pelvic fracture and repair. He expresses understanding and does wish to proceed.   CC: Dr. Debe Guzman  Edward Guzman

## 2018-02-28 NOTE — Progress Notes (Signed)
                               Care Plan Summary  Name: Edward Guzman DOB: 1946/04/17  Your Medical Team:   Urologist -  Dr. Raynelle Bring, [facility]  Radiation Oncologist - Dr. Tyler Pita, [facility]   Medical Oncologist - Dr. Zola Button, [place]  Recommendations: 1) Prostatectomy  2) Brachytherapy 3) Hormone injection with radiation  * These recommendations are based on information available as of today's consult.      Recommendations may change depending on the results of further tests or exams.  Next Steps: 1) Surgery- Dr. Alinda Money will contact you to schedule PT and surgery.    When appointments need to be scheduled, you will be contacted by [facility] and/or [facility].  Patient provided with business cards for all team members and a copy of "Fall Prevention Patient Safety" sheet.  Questions?  Please do not hesitate to call Cira Rue, RN, BSN, OCN at [phone] any questions or concerns.  Shirlean Mylar is your Oncology Nurse Navigator and is available to assist you while you're receiving your medical care at [facility].

## 2018-02-28 NOTE — Progress Notes (Signed)
Reason for the request: Prostate cancer      HPI: I was asked by Dr. Jeffie Pollock to evaluate Edward Guzman for the evaluation of new diagnosis of prostate cancer.  He is a rather healthy 72 year old man with no significant comorbid conditions.  He has history of a nephrectomy completed in 2006 for early stage renal cell carcinoma performed by Dr. Jeffie Pollock.  He was found to have an elevated PSA of 4.5 and underwent a prostate biopsy completed on January 23, 2018.  The biopsy showed a positive core of Gleason score 4+4 = 8 with 20% involvement.  He had a Gleason score 3+3 equal 6 as well.  He is reporting lower urinary tract symptoms including urgency at times but no hematuria or frequency.  He has been on Flomax for the last year.  He remains active and continues to attend activities of daily living.  He does not report any headaches, blurry vision, syncope or seizures. Does not report any fevers, chills or sweats.  Does not report any cough, wheezing or hemoptysis.  Does not report any chest pain, palpitation, orthopnea or leg edema.  Does not report any nausea, vomiting or abdominal pain.  Does not report any constipation or diarrhea.  Does not report any skeletal complaints.    Does not report frequency, urgency or hematuria.  Does not report any skin rashes or lesions. Does not report any heat or cold intolerance.  Does not report any lymphadenopathy or petechiae.  Does not report any anxiety or depression.  Remaining review of systems is negative.    Past Medical History:  Diagnosis Date  . BPH (benign prostatic hyperplasia) 2014  . Prostate cancer ([place])   . Renal cell carcinoma ([place])    right  :  Past Surgical History:  Procedure Laterality Date  . ELBOW SURGERY    . NEPHRECTOMY Right   . NEPHRECTOMY RADICAL  2006   open  . PELVIC FLOOR REPAIR    . SPLENECTOMY, TOTAL    :  No current outpatient medications on file.:  Allergies  Allergen Reactions  . Gastrografin [Diatrizoate Meglumine & Sodium]    . Other Hives  . Prochlorperazine Edisylate Swelling  :  Family History  Problem Relation Age of Onset  . Diabetes Son   :  Social History   Socioeconomic History  . Marital status: Married    Spouse name: Not on file  . Number of children: Not on file  . Years of education: Not on file  . Highest education level: Not on file  Occupational History  . Not on file  Social Needs  . Financial resource strain: Not on file  . Food insecurity:    Worry: Not on file    Inability: Not on file  . Transportation needs:    Medical: Not on file    Non-medical: Not on file  Tobacco Use  . Smoking status: Former Research scientist (life sciences)  . Smokeless tobacco: Never Used  Substance and Sexual Activity  . Alcohol use: Yes  . Drug use: Never  . Sexual activity: Not on file  Lifestyle  . Physical activity:    Days per week: Not on file    Minutes per session: Not on file  . Stress: Not on file  Relationships  . Social connections:    Talks on phone: Not on file    Gets together: Not on file    Attends religious service: Not on file    Active member of club or organization: Not  on file    Attends meetings of clubs or organizations: Not on file    Relationship status: Not on file  . Intimate partner violence:    Fear of current or ex partner: Not on file    Emotionally abused: Not on file    Physically abused: Not on file    Forced sexual activity: Not on file  Other Topics Concern  . Not on file  Social History Narrative  . Not on file  :  Pertinent items are noted in HPI.  Exam: ECOG 0 General appearance: alert and cooperative appeared without distress. Head: atraumatic without any abnormalities. Eyes: conjunctivae/corneas clear. PERRL.  Sclera anicteric. Throat: lips, mucosa, and tongue normal; without oral thrush or ulcers. Resp: clear to auscultation bilaterally without rhonchi, wheezes or dullness to percussion. Cardio: regular rate and rhythm, S1, S2 normal, no murmur, click, rub  or gallop GI: soft, non-tender; bowel sounds normal; no masses,  no organomegaly Skin: Skin color, texture, turgor normal. No rashes or lesions Lymph nodes: Cervical, supraclavicular, and axillary nodes normal. Neurologic: Grossly normal without any motor, sensory or deep tendon reflexes. Musculoskeletal: No joint deformity or effusion.      Nm Bone Scan Whole Body  Result Date: 02/08/2018 CLINICAL DATA:  Prostate cancer, PSA 4.93 EXAM: NUCLEAR MEDICINE WHOLE BODY BONE SCAN TECHNIQUE: Whole body anterior and posterior images were obtained approximately 3 hours after intravenous injection of radiopharmaceutical. RADIOPHARMACEUTICALS:  22.0 mCi Technetium-72m MDP IV COMPARISON:  None Correlation: CT pelvis 02/08/2018, CT abdomen pelvis 10/24/2006 FINDINGS: Minimal uptake at shoulders, knees, wrists, RIGHT elbow and RIGHT foot, typically degenerative. Minimal uptake at the anterior pubic bones bilaterally corresponding to plate/screws on CT from prior ORIF. No definite abnormal osseous tracer accumulation identified to suggest osseous metastatic disease. No activity identified within a RIGHT kidney; absent RIGHT kidney by remote CT. Otherwise expected urinary tract and soft tissue distribution of tracer. IMPRESSION: No scintigraphic evidence of osseous metastatic disease. Electronically Signed   By: Lavonia Dana M.D.   On: 02/08/2018 17:52    Assessment and Plan:   72 year old man with prostate cancer diagnosed in July 2019.  His Gleason score is 4+4 = 8 with a PSA of 4.5.  He does not have any evidence of metastatic disease at this time by CT scan and bone scan criteria.  His case was discussed today in the prostate cancer multidisciplinary clinic including review of his prostate biopsy and discussion with the reviewing pathologist.  His imaging studies were also discussed with the reviewing radiologist.  Treatment options were reviewed which include definitive radiation therapy with androgen  deprivation versus surgery.  The rationale for both approaches were reviewed from a medical oncology standpoint.  Both are equivalent approaches which offer curative options.  He understands if he develops metastatic disease treatment options do exist but those are mostly palliative in nature.  After discussion today he is favoring proceeding with surgery which offers him a convenient way of treating his cancer given his location in [place] and access to daily radiation treatment.  All his questions were answered to his satisfaction.  30  minutes was spent with the patient face-to-face today.  More than 50% of time was dedicated to patient counseling, education and coordination of his care.    Thank you for the referral.  A copy of this consult has been forwarded to the requesting physician.

## 2018-03-01 ENCOUNTER — Telehealth: Payer: Self-pay

## 2018-03-01 NOTE — Telephone Encounter (Signed)
Per 8/27 no los °

## 2018-03-02 ENCOUNTER — Encounter: Payer: Self-pay | Admitting: General Practice

## 2018-03-02 NOTE — Progress Notes (Signed)
Gardner participated in [facility] to introduce [facility] team/resources, completing distress screen per protocol.  The patient scored a 1 on the Psychosocial Distress Thermometer which indicates mild distress.    ONCBCN DISTRESS SCREENING 03/02/2018  Screening Type Initial Screening  Distress experienced in past week (1-10) 1  Referral to support programs Yes    Follow up needed: No. Mr Henslee reports mild distress and has full packet of [place], but please also page if needs arise or circumstances change. Thank you.   [place], [place], [facility] Pager [phone] Voicemail [phone]

## 2018-03-07 ENCOUNTER — Telehealth: Payer: Self-pay | Admitting: Medical Oncology

## 2018-03-07 NOTE — Telephone Encounter (Signed)
Left message as follow up to [facility]. I asked him to call me with questions or concerns. He has chosen robotic prostatectomy but has not been scheduled as of today.

## 2018-03-10 ENCOUNTER — Other Ambulatory Visit: Payer: Self-pay | Admitting: Urology

## 2018-03-17 ENCOUNTER — Telehealth: Payer: Self-pay | Admitting: Medical Oncology

## 2018-03-17 NOTE — Telephone Encounter (Signed)
Patient called and asked about pre-op appointment. He is scheduled for prostatectomy 04/18/18. I informed him that [facility] pre-op will call him 1-2 weeks before surgery to schedule. He voice understanding.

## 2018-04-11 NOTE — Patient Instructions (Addendum)
Edward Guzman  04/11/2018   Your procedure is scheduled on: 04-17-18     Report to Edward Guzman    Report to Admitting at 9:30 AM    Call this number if you have problems the morning of surgery [phone]   Remember: Do not eat food or drink liquids :After Midnight.    BRUSH YOUR TEETH MORNING OF SURGERY AND RINSE YOUR MOUTH OUT, NO CHEWING GUM CANDY OR MINTS.     Take these medicines the morning of surgery with A SIP OF WATER: None                                You may not have any metal on your body including hair pins and              piercings  Do not wear jewelry, make-up, lotions, powders, cologne or  deodorant             Men may shave face and neck.   Do not bring valuables to the hospital. Edward Guzman.  Contacts, dentures or bridgework may not be worn into surgery.  Leave suitcase in the car. After surgery it may be brought to your room.     Special Instructions: Please follow the prep instructions as provided by your surgeon              Please read over the following fact sheets you were given: _____________________________________________________________________             Edward Guzman - Preparing for Surgery Before surgery, you can play an important role.  Because skin is not sterile, your skin needs to be as free of germs as possible.  You can reduce the number of germs on your skin by washing with CHG (chlorahexidine gluconate) soap before surgery.  CHG is an antiseptic cleaner which kills germs and bonds with the skin to continue killing germs even after washing. Please DO NOT use if you have an allergy to CHG or antibacterial soaps.  If your skin becomes reddened/irritated stop using the CHG and inform your nurse when you arrive at [facility]. Do not shave (including legs and underarms) for at least 48 hours prior to the first CHG shower.  You may shave your  face/neck. Please follow these instructions carefully:  1.  Shower with CHG Soap the night before surgery and the  morning of Surgery.  2.  If you choose to wash your hair, wash your hair first as usual with your  normal  shampoo.  3.  After you shampoo, rinse your hair and body thoroughly to remove the  shampoo.                           4.  Use CHG as you would any other liquid soap.  You can apply chg directly  to the skin and wash                       Gently with a scrungie or clean washcloth.  5.  Apply the CHG Soap to your body ONLY FROM THE NECK DOWN.   Do not use on face/  open                           Wound or open sores. Avoid contact with eyes, ears mouth and genitals (private parts).                       Wash face,  Genitals (private parts) with your normal soap.             6.  Wash thoroughly, paying special attention to the area where your surgery  will be performed.  7.  Thoroughly rinse your body with warm water from the neck down.  8.  DO NOT shower/wash with your normal soap after using and rinsing off  the CHG Soap.                9.  Pat yourself dry with a clean towel.            10.  Wear clean pajamas.            11.  Place clean sheets on your bed the night of your first shower and do not  sleep with pets. Day of Surgery : Do not apply any lotions/deodorants the morning of surgery.  Please wear clean clothes to the hospital/surgery center.  FAILURE TO FOLLOW THESE INSTRUCTIONS MAY RESULT IN THE CANCELLATION OF YOUR SURGERY PATIENT SIGNATURE_________________________________  NURSE SIGNATURE__________________________________  ________________________________________________________________________   Edward Guzman  An incentive spirometer is a tool that can help keep your lungs clear and active. This tool measures how well you are filling your lungs with each breath. Taking long deep breaths may help reverse or decrease the chance of developing breathing  (pulmonary) problems (especially infection) following:  A long period of time when you are unable to move or be active. BEFORE THE PROCEDURE   If the spirometer includes an indicator to show your best effort, your nurse or respiratory therapist will set it to a desired goal.  If possible, sit up straight or lean slightly forward. Try not to slouch.  Hold the incentive spirometer in an upright position. INSTRUCTIONS FOR USE  1. Sit on the edge of your bed if possible, or sit up as far as you can in bed or on a chair. 2. Hold the incentive spirometer in an upright position. 3. Breathe out normally. 4. Place the mouthpiece in your mouth and seal your lips tightly around it. 5. Breathe in slowly and as deeply as possible, raising the piston or the ball toward the top of the column. 6. Hold your breath for 3-5 seconds or for as long as possible. Allow the piston or ball to fall to the bottom of the column. 7. Remove the mouthpiece from your mouth and breathe out normally. 8. Rest for a few seconds and repeat Steps 1 through 7 at least 10 times every 1-2 hours when you are awake. Take your time and take a few normal breaths between deep breaths. 9. The spirometer may include an indicator to show your best effort. Use the indicator as a goal to work toward during each repetition. 10. After each set of 10 deep breaths, practice coughing to be sure your lungs are clear. If you have an incision (the cut made at the time of surgery), support your incision when coughing by placing a pillow or rolled up towels firmly against it. Once you are able to get out of bed, walk around indoors and  cough well. You may stop using the incentive spirometer when instructed by your caregiver.  RISKS AND COMPLICATIONS  Take your time so you do not get dizzy or light-headed.  If you are in pain, you may need to take or ask for pain medication before doing incentive spirometry. It is harder to take a deep breath if you  are having pain. AFTER USE  Rest and breathe slowly and easily.  It can be helpful to keep track of a log of your progress. Your caregiver can provide you with a simple table to help with this. If you are using the spirometer at home, follow these instructions: Edward IF:   You are having difficultly using the spirometer.  You have trouble using the spirometer as often as instructed.  Your pain medication is not giving enough relief while using the spirometer.  You develop fever of 100.5 F (38.1 C) or higher. SEEK IMMEDIATE MEDICAL CARE IF:   You cough up bloody sputum that had not been present before.  You develop fever of 102 F (38.9 C) or greater.  You develop worsening pain at or near the incision site. MAKE SURE YOU:   Understand these instructions.  Will watch your condition.  Will get help right away if you are not doing well or get worse. Document Released: 11/01/2006 Document Revised: 09/13/2011 Document Reviewed: 01/02/2007 Exit[employer] Patient Information 2014 [employer], [place].   ________________________________________________________________________  WHAT IS A BLOOD TRANSFUSION? Blood Transfusion Information  A transfusion is the replacement of blood or some of its parts. Blood is made up of multiple cells which provide different functions.  Red blood cells carry oxygen and are used for blood loss replacement.  White blood cells fight against infection.  Platelets control bleeding.  Plasma helps clot blood.  Other blood products are available for specialized needs, such as hemophilia or other clotting disorders. BEFORE THE TRANSFUSION  Who gives blood for transfusions?   Healthy volunteers who are fully evaluated to make sure their blood is safe. This is blood bank blood. Transfusion therapy is the safest it has ever been in the practice of medicine. Before blood is taken from a donor, a complete history is taken to make sure that person has  no history of diseases nor engages in risky social behavior (examples are intravenous drug use or sexual activity with multiple partners). The donor's travel history is screened to minimize risk of transmitting infections, such as malaria. The donated blood is tested for signs of infectious diseases, such as HIV and hepatitis. The blood is then tested to be sure it is compatible with you in order to minimize the chance of a transfusion reaction. If you or a relative donates blood, this is often done in anticipation of surgery and is not appropriate for emergency situations. It takes many days to process the donated blood. RISKS AND COMPLICATIONS Although transfusion therapy is very safe and saves many lives, the main dangers of transfusion include:   Getting an infectious disease.  Developing a transfusion reaction. This is an allergic reaction to something in the blood you were given. Every precaution is taken to prevent this. The decision to have a blood transfusion has been considered carefully by your caregiver before blood is given. Blood is not given unless the benefits outweigh the risks. AFTER THE TRANSFUSION  Right after receiving a blood transfusion, you will usually feel much better and more energetic. This is especially true if your red blood cells have gotten low (anemic).  The transfusion raises the level of the red blood cells which carry oxygen, and this usually causes an energy increase.  The nurse administering the transfusion will monitor you carefully for complications. HOME CARE INSTRUCTIONS  No special instructions are needed after a transfusion. You may find your energy is better. Speak with your caregiver about any limitations on activity for underlying diseases you may have. SEEK MEDICAL CARE IF:   Your condition is not improving after your transfusion.  You develop redness or irritation at the intravenous (IV) site. SEEK IMMEDIATE MEDICAL CARE IF:  Any of the following  symptoms occur over the next 12 hours:  Shaking chills.  You have a temperature by mouth above 102 F (38.9 C), not controlled by medicine.  Chest, back, or muscle pain.  People around you feel you are not acting correctly or are confused.  Shortness of breath or difficulty breathing.  Dizziness and fainting.  You get a rash or develop hives.  You have a decrease in urine output.  Your urine turns a dark color or changes to pink, red, or brown. Any of the following symptoms occur over the next 10 days:  You have a temperature by mouth above 102 F (38.9 C), not controlled by medicine.  Shortness of breath.  Weakness after normal activity.  The white part of the eye turns yellow (jaundice).  You have a decrease in the amount of urine or are urinating less often.  Your urine turns a dark color or changes to pink, red, or brown. Document Released: 06/18/2000 Document Revised: 09/13/2011 Document Reviewed: 02/05/2008 [facility] Patient Information 2014 [place], [place].  _______________________________________________________________________

## 2018-04-12 ENCOUNTER — Encounter (INDEPENDENT_AMBULATORY_CARE_PROVIDER_SITE_OTHER): Payer: Self-pay

## 2018-04-12 ENCOUNTER — Other Ambulatory Visit: Payer: Self-pay

## 2018-04-12 ENCOUNTER — Encounter (HOSPITAL_COMMUNITY): Payer: Self-pay

## 2018-04-12 ENCOUNTER — Encounter (HOSPITAL_COMMUNITY)
Admission: RE | Admit: 2018-04-12 | Discharge: 2018-04-12 | Disposition: A | Discharge status: Home / Self Care | Payer: Medicare Other | Source: Ambulatory Visit | Attending: Urology | Admitting: Urology

## 2018-04-12 DIAGNOSIS — C61 Malignant neoplasm of prostate: Secondary | ICD-10-CM | POA: Diagnosis not present

## 2018-04-12 DIAGNOSIS — Z01812 Encounter for preprocedural laboratory examination: Secondary | ICD-10-CM | POA: Diagnosis present

## 2018-04-12 LAB — BASIC METABOLIC PANEL
ANION GAP: 8 (ref 5–15)
BUN: 15 mg/dL (ref 8–23)
CO2: 27 mmol/L (ref 22–32)
Calcium: 9.4 mg/dL (ref 8.9–10.3)
Chloride: 104 mmol/L (ref 98–111)
Creatinine, Ser: 1.12 mg/dL (ref 0.61–1.24)
GFR calc non Af Amer: >60 mL/min (ref >60)
Glucose, Bld: 104 mg/dL — ABNORMAL HIGH (ref 70–99)
POTASSIUM: 4.5 mmol/L (ref 3.5–5.1)
SODIUM: 139 mmol/L (ref 135–145)

## 2018-04-12 LAB — CBC
HCT: 46.3 % (ref 39.0–52.0)
HEMOGLOBIN: 15.9 g/dL (ref 13.0–17.0)
MCH: 32.7 pg (ref 26.0–34.0)
MCHC: 34.3 g/dL (ref 30.0–36.0)
MCV: 95.3 fL (ref 80.0–100.0)
NRBC: 0 % (ref 0.0–0.2)
Platelets: 200 10*3/uL (ref 150–400)
RBC: 4.86 MIL/uL (ref 4.22–5.81)
RDW: 12.2 % (ref 11.5–15.5)
WBC: 6.5 10*3/uL (ref 4.0–10.5)

## 2018-04-14 NOTE — H&P (Signed)
CC/HPI: CC: Prostate Cancer    [name] is a 72 year old gentleman who was found to have an elevated PSA of 4.5. This prompted a TRUS biopsy of the prostate by Dr. Jeffie Pollock on 01/23/18 that demonstrated Gleason 4+4=8 adenocarcinoma of the prostate with 2 out of 12 biopsy cores positive for maligancy.   Family history: None.   Imaging studies:  CT pelvis (02/08/18): Negative for metastatic disease.  Bone scan (02/08/18): Negative for metastatic disease.   PMH: He has a history of GERD, hyperlipidemia, renal cell carcinoma (pT1a Nx Mx, clear cell), and BPH.  PSH: Right open radical nephrectomy and pelvic repair of a diastasis injury in 2006. He does have hardware that was placed in his pubic symphysis via a low transverse incision.   TNM stage: cT1c N0 M0  PSA: 4.5  Gleason score: 4+4=8  Biopsy (01/23/18): 2/12 cores positive  Left: L lateral mid (5%, 3+3=6)  Right: R base (20%, 4+4=8)  Prostate volume: 64.5 cc   Nomogram  OC disease: 38%  EPE: 60%  SVI: 8%  LNI: 10%  PFS (5 year, 10 year): 62%, 46%   Urinary function: IPSS is 11.  Erectile function: SHIM score is 25. He does take male herbal supplements such as Nugenix or ageless male.     ALLERGIES: Compazine SOLN Contrast Dye Gastrografin SOLN    MEDICATIONS: Tamsulosin Hcl 0.4 mg capsule  Prilosec Otc  Sildenafil 20 mg tablet 1-5 tablets as needed  Vytorin 10 mg-80 mg tablet Oral     GU PSH: Locm 300-399Mg /Ml Iodine,1Ml - 02/08/2018 Prostate Needle Biopsy - 01/23/2018 Radical nephrectomy (open) - about 2006      [place] Notes: Pelvic Repair, Total Splenectomy, Elbow Surgery, Nephrectomy Right   NON-GU PSH: Splenectomy - 2008 Surgical Pathology, Gross And Microscopic Examination For Prostate Needle - 01/23/2018    GU PMH: Elevated PSA - 01/23/2018, He has a progressively rising PSA that is up to 4.5. I am going to have him return for a prostate [place] and biopsy. I reviewed the risks of bleeding, infection and difficulty  voiding. , - 12/01/2017 BPH w/LUTS, He is voiding well on tamsulosin. - 12/01/2017 ED due to arterial insufficiency, He was dispensed sildenafil. - 12/01/2017 Urinary Urgency - 12/01/2017 BPH w/o LUTS, Benign prostatic hypertrophy without lower urinary tract symptoms - 2014 Renal cell carcinoma, right      PMH Notes:  1898-07-05 00:00:00 - Note: Normal Routine History And Physical Adult  2008-06-07 13:54:45 - Note: Serum Enzyme Levels - ALT (SGPT) Elevated  2009-11-07 13:52:40 - Note: Kidney Cancer   NON-GU PMH: Personal history of other diseases of the digestive system, History of esophageal reflux - 2014 Personal history of other endocrine, nutritional and metabolic disease, History of hypercholesterolemia - 2014 Hypercholesterolemia    FAMILY HISTORY: Diabetes - Son   SOCIAL HISTORY: Marital Status: Married Preferred Language: English; Race: White Current Smoking Status: Patient does not smoke anymore.   Tobacco Use Assessment Completed: Used Tobacco in last 30 days? Drinks 4+ caffeinated drinks per day.     Notes: Occupation:, Alcohol Use, Caffeine Use, Marital History - Currently Married, Tobacco Use   REVIEW OF SYSTEMS:    GU Review Male:   Patient denies frequent urination, hard to postpone urination, burning/ pain with urination, get up at night to urinate, leakage of urine, stream starts and stops, trouble starting your streams, and have to strain to urinate .  Gastrointestinal (Upper):   Patient denies nausea and vomiting.  Gastrointestinal (  Lower):   Patient denies diarrhea and constipation.  Constitutional:   Patient denies fever, night sweats, weight loss, and fatigue.  Skin:   Patient denies skin rash/ lesion and itching.  Eyes:   Patient denies blurred vision and double vision.  Ears/ Nose/ Throat:   Patient denies sore throat and sinus problems.  Hematologic/Lymphatic:   Patient denies swollen glands and easy bruising.  Cardiovascular:   Patient denies leg swelling  and chest pains.  Respiratory:   Patient denies cough and shortness of breath.  Endocrine:   Patient denies excessive thirst.  Musculoskeletal:   Patient denies back pain and joint pain.  Neurological:   Patient denies headaches and dizziness.  Psychologic:   Patient denies depression and anxiety.     MULTI-SYSTEM PHYSICAL EXAMINATION:    Constitutional: Well-nourished. No physical deformities. Normally developed. Good grooming.  Neck: Neck symmetrical, not swollen. Normal tracheal position.  Respiratory: No labored breathing, no use of accessory muscles. Clear bilaterally.  Cardiovascular: Normal temperature, normal extremity pulses, no swelling, no varicosities. Regular rate and rhythm.  Lymphatic: No enlargement of neck, axillae, groin.  Skin: No paleness, no jaundice, no cyanosis. No lesion, no ulcer, no rash.  Neurologic / Psychiatric: Oriented to time, oriented to place, oriented to person. No depression, no anxiety, no agitation.  Gastrointestinal: No mass, no tenderness, no rigidity, non obese abdomen. He does have a very small transverse incision over his pubis. He has a large right subcostal incision from his prior nephrectomy.  Eyes: Normal conjunctivae. Normal eyelids.  Ears, Nose, Mouth, and Throat: Left ear no scars, no lesions, no masses. Right ear no scars, no lesions, no masses. Nose no scars, no lesions, no masses. Normal hearing. Normal lips.  Musculoskeletal: Normal gait and station of head and neck.    ASSESSMENT:     1 GU:   Prostate Cancer - C61    PLAN:      1. Prostate cancer: He has elected to proceed with surgical treatment and will undergo a robotic assisted laparoscopic radical prostatectomy and bilateral pelvic lymphadenectomy.

## 2018-04-17 ENCOUNTER — Encounter (HOSPITAL_COMMUNITY)
Admission: RE | Disposition: A | Discharge status: Home / Self Care | Payer: Self-pay | Source: Ambulatory Visit | Attending: Urology

## 2018-04-17 ENCOUNTER — Encounter (HOSPITAL_COMMUNITY): Payer: Self-pay | Admitting: *Deleted

## 2018-04-17 ENCOUNTER — Ambulatory Visit (HOSPITAL_COMMUNITY): Payer: Medicare Other | Admitting: Anesthesiology

## 2018-04-17 ENCOUNTER — Other Ambulatory Visit: Payer: Self-pay

## 2018-04-17 ENCOUNTER — Observation Stay (HOSPITAL_COMMUNITY)
Admission: RE | Admit: 2018-04-17 | Discharge: 2018-04-18 | Disposition: A | Discharge status: Home / Self Care | Payer: Medicare Other | Source: Ambulatory Visit | Attending: Urology | Admitting: Urology

## 2018-04-17 DIAGNOSIS — Z87891 Personal history of nicotine dependence: Secondary | ICD-10-CM | POA: Insufficient documentation

## 2018-04-17 DIAGNOSIS — K219 Gastro-esophageal reflux disease without esophagitis: Secondary | ICD-10-CM | POA: Insufficient documentation

## 2018-04-17 DIAGNOSIS — E7800 Pure hypercholesterolemia, unspecified: Secondary | ICD-10-CM | POA: Insufficient documentation

## 2018-04-17 DIAGNOSIS — C61 Malignant neoplasm of prostate: Principal | ICD-10-CM | POA: Insufficient documentation

## 2018-04-17 DIAGNOSIS — Z85528 Personal history of other malignant neoplasm of kidney: Secondary | ICD-10-CM | POA: Insufficient documentation

## 2018-04-17 DIAGNOSIS — Z905 Acquired absence of kidney: Secondary | ICD-10-CM | POA: Diagnosis not present

## 2018-04-17 HISTORY — PX: ROBOT ASSISTED LAPAROSCOPIC RADICAL PROSTATECTOMY: SHX5141

## 2018-04-17 HISTORY — PX: LYMPHADENECTOMY: SHX5960

## 2018-04-17 LAB — HEMOGLOBIN AND HEMATOCRIT, BLOOD
HEMATOCRIT: 43.3 % (ref 39.0–52.0)
Hemoglobin: 14.4 g/dL (ref 13.0–17.0)

## 2018-04-17 LAB — TYPE AND SCREEN
ABO/RH(D): B NEG
Antibody Screen: NEGATIVE

## 2018-04-17 SURGERY — XI ROBOTIC ASSISTED LAPAROSCOPIC RADICAL PROSTATECTOMY LEVEL 3
Anesthesia: General

## 2018-04-17 MED ORDER — EPHEDRINE 5 MG/ML INJ
INTRAVENOUS | Status: AC
  Filled 2018-04-17: qty 10

## 2018-04-17 MED ORDER — KETOROLAC TROMETHAMINE 15 MG/ML IJ SOLN
15.0000 mg | Freq: Four times a day (QID) | INTRAMUSCULAR | Status: DC
  Administered 2018-04-17 – 2018-04-18 (×3): 15 mg via INTRAVENOUS
  Filled 2018-04-17 (×2): qty 1

## 2018-04-17 MED ORDER — PROMETHAZINE HCL 25 MG/ML IJ SOLN: 6.2500 mg | INTRAMUSCULAR | Status: DC | PRN

## 2018-04-17 MED ORDER — DIPHENHYDRAMINE HCL 50 MG/ML IJ SOLN: 12.5000 mg | Freq: Four times a day (QID) | INTRAMUSCULAR | Status: DC | PRN

## 2018-04-17 MED ORDER — KCL IN DEXTROSE-NACL 20-5-0.45 MEQ/L-%-% IV SOLN
INTRAVENOUS | Status: DC
  Administered 2018-04-17 – 2018-04-18 (×3): via INTRAVENOUS
  Filled 2018-04-17 (×4): qty 1000

## 2018-04-17 MED ORDER — DEXAMETHASONE SODIUM PHOSPHATE 10 MG/ML IJ SOLN
INTRAMUSCULAR | Status: DC | PRN
  Administered 2018-04-17: 10 mg via INTRAVENOUS

## 2018-04-17 MED ORDER — CEFAZOLIN SODIUM-DEXTROSE 1-4 GM/50ML-% IV SOLN
1.0000 g | Freq: Three times a day (TID) | INTRAVENOUS | Status: AC
  Administered 2018-04-17 – 2018-04-18 (×2): 1 g via INTRAVENOUS
  Filled 2018-04-17 (×2): qty 50

## 2018-04-17 MED ORDER — KETOROLAC TROMETHAMINE 15 MG/ML IJ SOLN
INTRAMUSCULAR | Status: AC
  Filled 2018-04-17: qty 1

## 2018-04-17 MED ORDER — EPHEDRINE SULFATE 50 MG/ML IJ SOLN
INTRAMUSCULAR | Status: DC | PRN
  Administered 2018-04-17: 5 mg via INTRAVENOUS

## 2018-04-17 MED ORDER — ACETAMINOPHEN 10 MG/ML IV SOLN
INTRAVENOUS | Status: AC
  Administered 2018-04-17: 1000 mg
  Filled 2018-04-17: qty 100

## 2018-04-17 MED ORDER — HYDROMORPHONE HCL 1 MG/ML IJ SOLN
INTRAMUSCULAR | Status: DC | PRN
  Administered 2018-04-17 (×4): 0.5 mg via INTRAVENOUS

## 2018-04-17 MED ORDER — SODIUM CHLORIDE 0.9 % IR SOLN
Status: DC | PRN
  Administered 2018-04-17: 1 via INTRAVESICAL

## 2018-04-17 MED ORDER — LIDOCAINE 2% (20 MG/ML) 5 ML SYRINGE
INTRAMUSCULAR | Status: DC | PRN
  Administered 2018-04-17: 60 mg via INTRAVENOUS

## 2018-04-17 MED ORDER — TRAMADOL HCL 50 MG PO TABS: 50.0000 mg | ORAL_TABLET | Freq: Four times a day (QID) | ORAL | 0 refills | Status: AC | PRN

## 2018-04-17 MED ORDER — PROPOFOL 10 MG/ML IV BOLUS
INTRAVENOUS | Status: AC
  Filled 2018-04-17: qty 20

## 2018-04-17 MED ORDER — HYDROMORPHONE HCL 1 MG/ML IJ SOLN
0.2500 mg | INTRAMUSCULAR | Status: DC | PRN
  Administered 2018-04-17 (×4): 0.5 mg via INTRAVENOUS

## 2018-04-17 MED ORDER — BACITRACIN-NEOMYCIN-POLYMYXIN 400-5-5000 EX OINT: 1.0000 "application " | TOPICAL_OINTMENT | Freq: Three times a day (TID) | CUTANEOUS | Status: DC | PRN

## 2018-04-17 MED ORDER — STERILE WATER FOR IRRIGATION IR SOLN
Status: DC | PRN
  Administered 2018-04-17: 1000 mL via INTRAVESICAL

## 2018-04-17 MED ORDER — BUPIVACAINE HCL (PF) 0.25 % IJ SOLN
INTRAMUSCULAR | Status: AC
  Filled 2018-04-17: qty 30

## 2018-04-17 MED ORDER — PANTOPRAZOLE SODIUM 40 MG PO TBEC
40.0000 mg | DELAYED_RELEASE_TABLET | Freq: Every day | ORAL | Status: DC
  Administered 2018-04-17 – 2018-04-18 (×2): 40 mg via ORAL
  Filled 2018-04-17 (×2): qty 1

## 2018-04-17 MED ORDER — SODIUM CHLORIDE 0.9 % IV BOLUS
1000.0000 mL | Freq: Once | INTRAVENOUS | Status: AC
  Administered 2018-04-17: 1000 mL via INTRAVENOUS

## 2018-04-17 MED ORDER — ONDANSETRON HCL 4 MG/2ML IJ SOLN
INTRAMUSCULAR | Status: DC | PRN
  Administered 2018-04-17: 4 mg via INTRAVENOUS

## 2018-04-17 MED ORDER — HYDROMORPHONE HCL 2 MG/ML IJ SOLN
INTRAMUSCULAR | Status: AC
  Filled 2018-04-17: qty 1

## 2018-04-17 MED ORDER — DOCUSATE SODIUM 100 MG PO CAPS
100.0000 mg | ORAL_CAPSULE | Freq: Two times a day (BID) | ORAL | Status: DC
  Administered 2018-04-17 – 2018-04-18 (×2): 100 mg via ORAL
  Filled 2018-04-17 (×2): qty 1

## 2018-04-17 MED ORDER — MORPHINE SULFATE (PF) 2 MG/ML IV SOLN
2.0000 mg | INTRAVENOUS | Status: DC | PRN
  Administered 2018-04-17: 2 mg via INTRAVENOUS
  Filled 2018-04-17: qty 1

## 2018-04-17 MED ORDER — ROCURONIUM BROMIDE 100 MG/10ML IV SOLN
INTRAVENOUS | Status: AC
  Filled 2018-04-17: qty 1

## 2018-04-17 MED ORDER — FENTANYL CITRATE (PF) 250 MCG/5ML IJ SOLN
INTRAMUSCULAR | Status: AC
  Filled 2018-04-17: qty 5

## 2018-04-17 MED ORDER — BELLADONNA ALKALOIDS-OPIUM 16.2-60 MG RE SUPP
RECTAL | Status: AC
  Filled 2018-04-17: qty 1

## 2018-04-17 MED ORDER — ONDANSETRON HCL 4 MG/2ML IJ SOLN: 4.0000 mg | INTRAMUSCULAR | Status: DC | PRN

## 2018-04-17 MED ORDER — BELLADONNA ALKALOIDS-OPIUM 16.2-60 MG RE SUPP
1.0000 | Freq: Four times a day (QID) | RECTAL | Status: DC | PRN
  Administered 2018-04-17: 1 via RECTAL
  Filled 2018-04-17: qty 1

## 2018-04-17 MED ORDER — DIPHENHYDRAMINE HCL 12.5 MG/5ML PO ELIX: 12.5000 mg | ORAL_SOLUTION | Freq: Four times a day (QID) | ORAL | Status: DC | PRN

## 2018-04-17 MED ORDER — ROCURONIUM BROMIDE 50 MG/5ML IV SOSY
PREFILLED_SYRINGE | INTRAVENOUS | Status: DC | PRN
  Administered 2018-04-17 (×2): 10 mg via INTRAVENOUS
  Administered 2018-04-17: 70 mg via INTRAVENOUS
  Administered 2018-04-17 (×2): 10 mg via INTRAVENOUS
  Administered 2018-04-17: 20 mg via INTRAVENOUS

## 2018-04-17 MED ORDER — SUGAMMADEX SODIUM 200 MG/2ML IV SOLN
INTRAVENOUS | Status: AC
  Filled 2018-04-17: qty 2

## 2018-04-17 MED ORDER — LACTATED RINGERS IV SOLN
INTRAVENOUS | Status: DC
  Administered 2018-04-17 (×2): via INTRAVENOUS

## 2018-04-17 MED ORDER — SUGAMMADEX SODIUM 200 MG/2ML IV SOLN
INTRAVENOUS | Status: DC | PRN
  Administered 2018-04-17: 200 mg via INTRAVENOUS

## 2018-04-17 MED ORDER — MIDAZOLAM HCL 5 MG/5ML IJ SOLN
INTRAMUSCULAR | Status: DC | PRN
  Administered 2018-04-17: 2 mg via INTRAVENOUS

## 2018-04-17 MED ORDER — LACTATED RINGERS IV SOLN
INTRAVENOUS | Status: DC | PRN
  Administered 2018-04-17: 1000 mL

## 2018-04-17 MED ORDER — CEFAZOLIN SODIUM-DEXTROSE 2-4 GM/100ML-% IV SOLN
2.0000 g | Freq: Once | INTRAVENOUS | Status: AC
  Administered 2018-04-17: 2 g via INTRAVENOUS
  Filled 2018-04-17: qty 100

## 2018-04-17 MED ORDER — HEPARIN SODIUM (PORCINE) 1000 UNIT/ML IJ SOLN
INTRAMUSCULAR | Status: AC
  Filled 2018-04-17: qty 1

## 2018-04-17 MED ORDER — ACETAMINOPHEN 325 MG PO TABS: 650.0000 mg | ORAL_TABLET | ORAL | Status: DC | PRN

## 2018-04-17 MED ORDER — SULFAMETHOXAZOLE-TRIMETHOPRIM 800-160 MG PO TABS: 1.0000 | ORAL_TABLET | Freq: Two times a day (BID) | ORAL | 0 refills | Status: AC

## 2018-04-17 MED ORDER — HYDROMORPHONE HCL 1 MG/ML IJ SOLN
INTRAMUSCULAR | Status: AC
  Filled 2018-04-17: qty 2

## 2018-04-17 MED ORDER — ONDANSETRON HCL 4 MG/2ML IJ SOLN
INTRAMUSCULAR | Status: AC
  Filled 2018-04-17: qty 2

## 2018-04-17 MED ORDER — EZETIMIBE-SIMVASTATIN 10-40 MG PO TABS
1.0000 | ORAL_TABLET | Freq: Every day | ORAL | Status: DC
  Administered 2018-04-17 – 2018-04-18 (×2): 1 via ORAL
  Filled 2018-04-17 (×2): qty 1

## 2018-04-17 MED ORDER — BELLADONNA ALKALOIDS-OPIUM 16.2-60 MG RE SUPP
1.0000 | Freq: Every day | RECTAL | Status: DC
  Administered 2018-04-17: 1 via RECTAL

## 2018-04-17 MED ORDER — PROPOFOL 10 MG/ML IV BOLUS
INTRAVENOUS | Status: DC | PRN
  Administered 2018-04-17: 140 mg via INTRAVENOUS

## 2018-04-17 MED ORDER — FENTANYL CITRATE (PF) 100 MCG/2ML IJ SOLN
INTRAMUSCULAR | Status: DC | PRN
  Administered 2018-04-17: 100 ug via INTRAVENOUS
  Administered 2018-04-17: 25 ug via INTRAVENOUS
  Administered 2018-04-17: 100 ug via INTRAVENOUS
  Administered 2018-04-17: 25 ug via INTRAVENOUS

## 2018-04-17 MED ORDER — BUPIVACAINE HCL (PF) 0.25 % IJ SOLN
INTRAMUSCULAR | Status: DC | PRN
  Administered 2018-04-17: 30 mL

## 2018-04-17 MED ORDER — MIDAZOLAM HCL 2 MG/2ML IJ SOLN
INTRAMUSCULAR | Status: AC
  Filled 2018-04-17: qty 2

## 2018-04-17 SURGICAL SUPPLY — 63 items
ADH SKN CLS APL DERMABOND .7 (GAUZE/BANDAGES/DRESSINGS) ×2
APL SWBSTK 6 STRL LF DISP (MISCELLANEOUS) ×2
APPLICATOR COTTON TIP 6 STRL (MISCELLANEOUS) ×2 IMPLANT
APPLICATOR COTTON TIP 6IN STRL (MISCELLANEOUS) ×4
CATH FOLEY 2WAY SLVR 18FR 30CC (CATHETERS) ×4 IMPLANT
CATH ROBINSON RED A/P 16FR (CATHETERS) ×4 IMPLANT
CATH ROBINSON RED A/P 8FR (CATHETERS) ×4 IMPLANT
CATH TIEMANN FOLEY 18FR 5CC (CATHETERS) ×4 IMPLANT
CHLORAPREP W/TINT 26ML (MISCELLANEOUS) ×4 IMPLANT
CLIP VESOLOCK LG 6/CT PURPLE (CLIP) ×8 IMPLANT
COVER SURGICAL LIGHT HANDLE (MISCELLANEOUS) ×4 IMPLANT
COVER TIP SHEARS 8 DVNC (MISCELLANEOUS) ×2 IMPLANT
COVER TIP SHEARS 8MM DA VINCI (MISCELLANEOUS) ×2
COVER WAND RF STERILE (DRAPES) ×2 IMPLANT
CUTTER ECHEON FLEX ENDO 45 340 (ENDOMECHANICALS) ×4 IMPLANT
DECANTER SPIKE VIAL GLASS SM (MISCELLANEOUS) ×4 IMPLANT
DERMABOND ADVANCED (GAUZE/BANDAGES/DRESSINGS) ×2
DERMABOND ADVANCED .7 DNX12 (GAUZE/BANDAGES/DRESSINGS) IMPLANT
DRAPE ARM DVNC X/XI (DISPOSABLE) ×8 IMPLANT
DRAPE COLUMN DVNC XI (DISPOSABLE) ×2 IMPLANT
DRAPE DA VINCI XI ARM (DISPOSABLE) ×8
DRAPE DA VINCI XI COLUMN (DISPOSABLE) ×2
DRAPE SURG IRRIG POUCH 19X23 (DRAPES) ×4 IMPLANT
DRSG TEGADERM 4X4.75 (GAUZE/BANDAGES/DRESSINGS) ×4 IMPLANT
ELECT REM PT RETURN 15FT ADLT (MISCELLANEOUS) ×4 IMPLANT
GLOVE BIO SURGEON STRL SZ 6.5 (GLOVE) ×3 IMPLANT
GLOVE BIO SURGEONS STRL SZ 6.5 (GLOVE) ×1
GLOVE BIOGEL M STRL SZ7.5 (GLOVE) ×8 IMPLANT
GLOVE BIOGEL PI IND STRL 7.0 (GLOVE) IMPLANT
GLOVE BIOGEL PI INDICATOR 7.0 (GLOVE) ×2
GLOVE ECLIPSE 7.0 STRL STRAW (GLOVE) ×2 IMPLANT
GOWN STRL REUS W/TWL LRG LVL3 (GOWN DISPOSABLE) ×14 IMPLANT
HOLDER FOLEY CATH W/STRAP (MISCELLANEOUS) ×4 IMPLANT
IRRIG SUCT STRYKERFLOW 2 WTIP (MISCELLANEOUS) ×4
IRRIGATION SUCT STRKRFLW 2 WTP (MISCELLANEOUS) ×2 IMPLANT
IV LACTATED RINGERS 1000ML (IV SOLUTION) ×4 IMPLANT
NDL SAFETY ECLIPSE 18X1.5 (NEEDLE) ×2 IMPLANT
NEEDLE HYPO 18GX1.5 SHARP (NEEDLE) ×4
PACK ROBOT UROLOGY CUSTOM (CUSTOM PROCEDURE TRAY) ×4 IMPLANT
RELOAD STAPLE 45 4.1 GRN THCK (STAPLE) ×2 IMPLANT
SEAL CANN UNIV 5-8 DVNC XI (MISCELLANEOUS) ×8 IMPLANT
SEAL XI 5MM-8MM UNIVERSAL (MISCELLANEOUS) ×8
SOLUTION ELECTROLUBE (MISCELLANEOUS) ×4 IMPLANT
STAPLE RELOAD 45 GRN (STAPLE) ×2 IMPLANT
STAPLE RELOAD 45MM GREEN (STAPLE) ×4
SUT ETHILON 3 0 PS 1 (SUTURE) ×4 IMPLANT
SUT MNCRL 3 0 RB1 (SUTURE) ×2 IMPLANT
SUT MNCRL 3 0 VIOLET RB1 (SUTURE) ×2 IMPLANT
SUT MNCRL AB 4-0 PS2 18 (SUTURE) ×8 IMPLANT
SUT MONOCRYL 3 0 RB1 (SUTURE) ×4
SUT VIC AB 0 CT1 27 (SUTURE) ×4
SUT VIC AB 0 CT1 27XBRD ANTBC (SUTURE) ×2 IMPLANT
SUT VIC AB 0 UR5 27 (SUTURE) ×4 IMPLANT
SUT VIC AB 2-0 SH 27 (SUTURE) ×4
SUT VIC AB 2-0 SH 27X BRD (SUTURE) ×2 IMPLANT
SUT VIC AB 3-0 SH 27 (SUTURE) ×8
SUT VIC AB 3-0 SH 27XBRD (SUTURE) IMPLANT
SUT VICRYL 0 UR6 27IN ABS (SUTURE) ×8 IMPLANT
SYR 27GX1/2 1ML LL SAFETY (SYRINGE) ×4 IMPLANT
TOWEL OR 17X26 10 PK STRL BLUE (TOWEL DISPOSABLE) ×2 IMPLANT
TOWEL OR NON WOVEN STRL DISP B (DISPOSABLE) ×4 IMPLANT
TUBING INSUFFLATION 10FT LAP (TUBING) IMPLANT
WATER STERILE IRR 1000ML POUR (IV SOLUTION) ×6 IMPLANT

## 2018-04-17 NOTE — Op Note (Signed)
Preoperative diagnosis: Clinically localized adenocarcinoma of the prostate (clinical stage T1c N0 M0)  Postoperative diagnosis: Clinically localized adenocarcinoma of the prostate (clinical stage T1c N0 M0)  Procedure:  1. Robotic assisted laparoscopic radical prostatectomy (bilateral nerve sparing) 2. Bilateral robotic assisted laparoscopic pelvic lymphadenectomy  Surgeon: Pryor Curia. M.D.  Assistant: Debbrah Alar, PA-C  An assistant was required for this surgical procedure.  The duties of the assistant included but were not limited to suctioning, passing suture, camera manipulation, retraction. This procedure would not be able to be performed without an Environmental consultant.  Resident: Dr. Basilio Cairo  Anesthesia: General  Complications: None  EBL: 50 mL  IVF:  2000 mL crystalloid  Specimens: 1. Prostate and seminal vesicles 2. Right pelvic lymph nodes 3. Left pelvic lymph nodes  Disposition of specimens: Pathology  Drains: 1. 20 Fr coude catheter 2. # 19 Blake pelvic drain  Indication: Edward Guzman is a 72 y.o. year old patient with clinically localized prostate cancer.  After a thorough review of the management options for treatment of prostate cancer, he elected to proceed with surgical therapy and the above procedure(s).  We have discussed the potential benefits and risks of the procedure, side effects of the proposed treatment, the likelihood of the patient achieving the goals of the procedure, and any potential problems that might occur during the procedure or recuperation. Informed consent has been obtained.  Description of procedure:  The patient was taken to the operating room and a general anesthetic was administered. He was given preoperative antibiotics, placed in the dorsal lithotomy position, and prepped and draped in the usual sterile fashion. Next a preoperative timeout was performed. A urethral catheter was placed into the bladder and a site was  selected near the umbilicus for placement of the camera port. This was placed using a standard open Hassan technique which allowed entry into the peritoneal cavity under direct vision and without difficulty. An 8 mm robotic port was placed and a pneumoperitoneum established. The camera was then used to inspect the abdomen and there was no evidence of any intra-abdominal injuries or other abnormalities. The remaining abdominal ports were then placed. 8 mm robotic ports were placed in the right lower quadrant, left lower quadrant, and far left lateral abdominal wall. A 5 mm port was placed in the right upper quadrant and a 12 mm port was placed in the right lateral abdominal wall for laparoscopic assistance. All ports were placed under direct vision without difficulty. The surgical cart was then docked.   Utilizing the cautery scissors, the bladder was reflected posteriorly allowing entry into the space of Retzius and identification of the endopelvic fascia and prostate. The periprostatic fat was then removed from the prostate allowing full exposure of the endopelvic fascia. The endopelvic fascia was then incised from the apex back to the base of the prostate bilaterally and the underlying levator muscle fibers were swept laterally off the prostate thereby isolating the dorsal venous complex. The dorsal vein was then stapled and divided with a 45 mm Flex Echelon stapler. Attention then turned to the bladder neck which was divided anteriorly thereby allowing entry into the bladder and exposure of the urethral catheter. The catheter balloon was deflated and the catheter was brought into the operative field and used to retract the prostate anteriorly. The posterior bladder neck was then examined and was divided allowing further dissection between the bladder and prostate posteriorly until the vasa deferentia and seminal vessels were identified. The vasa deferentia were  isolated, divided, and lifted anteriorly. The  seminal vesicles were dissected down to their tips with care to control the seminal vascular arterial blood supply. These structures were then lifted anteriorly and the space between Denonvillier's fascia and the anterior rectum was developed with a combination of sharp and blunt dissection. This isolated the vascular pedicles of the prostate.  The lateral prostatic fascia was then sharply incised allowing release of the neurovascular bundles bilaterally. The vascular pedicles of the prostate were then ligated with Weck clips between the prostate and neurovascular bundles and divided with sharp cold scissor dissection resulting in neurovascular bundle preservation. The neurovascular bundles were then separated off the apex of the prostate and urethra bilaterally.  The urethra was then sharply transected allowing the prostate specimen to be disarticulated. The pelvis was copiously irrigated and hemostasis was ensured. There was no evidence for rectal injury.  Attention then turned to the right pelvic sidewall. The fibrofatty tissue between the external iliac vein, confluence of the iliac vessels, hypogastric artery, and Cooper's ligament was dissected free from the pelvic sidewall with care to preserve the obturator nerve. Weck clips were used for lymphostasis and hemostasis. An identical procedure was performed on the contralateral side and the lymphatic packets were removed for permanent pathologic analysis.  Attention then turned to the urethral anastomosis. A 2-0 Vicryl slip knot was placed between Denonvillier's fascia, the posterior bladder neck, and the posterior urethra to reapproximate these structures. A double-armed 3-0 Monocryl suture was then used to perform a 360 running tension-free anastomosis between the bladder neck and urethra. A new urethral catheter was then placed into the bladder and irrigated. There were no blood clots within the bladder and the anastomosis appeared to be watertight.  A #19 Blake drain was then brought through the left lateral 8 mm port site and positioned appropriately within the pelvis. It was secured to the skin with a nylon suture. The surgical cart was then undocked. The right lateral 12 mm port site was closed at the fascial level with a 0 Vicryl suture placed laparoscopically. All remaining ports were then removed under direct vision. The prostate specimen was removed intact within the Endopouch retrieval bag via the periumbilical camera port site. This fascial opening was closed with two running 0 Vicryl sutures. 0.25% Marcaine was then injected into all port sites and all incisions were reapproximated at the skin level with 4-0 Monocryl subcuticular sutures and Dermabond. The patient appeared to tolerate the procedure well and without complications. The patient was able to be extubated and transferred to the recovery unit in satisfactory condition.   Pryor Curia MD

## 2018-04-17 NOTE — Plan of Care (Signed)

## 2018-04-17 NOTE — Anesthesia Preprocedure Evaluation (Signed)
Anesthesia Evaluation  Patient identified by MRN, date of birth, ID band Patient awake    Reviewed: Allergy & Precautions, NPO status , Patient's Chart, lab work & pertinent test results  Airway Mallampati: II  TM Distance: >3 FB Neck ROM: Full    Dental no notable dental hx.    Pulmonary neg pulmonary ROS, former smoker,    Pulmonary exam normal breath sounds clear to auscultation       Cardiovascular negative cardio ROS Normal cardiovascular exam Rhythm:Regular Rate:Normal     Neuro/Psych negative neurological ROS  negative psych ROS   GI/Hepatic negative GI ROS, Neg liver ROS,   Endo/Other  negative endocrine ROS  Renal/GU negative Renal ROS  negative genitourinary   Musculoskeletal negative musculoskeletal ROS (+)   Abdominal   Peds negative pediatric ROS (+)  Hematology negative hematology ROS (+)   Anesthesia Other Findings   Reproductive/Obstetrics negative OB ROS                            Anesthesia Physical Anesthesia Plan  ASA: II  Anesthesia Plan: General   Post-op Pain Management:    Induction: Intravenous  PONV Risk Score and Plan: 2 and Ondansetron, Dexamethasone and Treatment may vary due to age or medical condition  Airway Management Planned: Oral ETT  Additional Equipment:   Intra-op Plan:   Post-operative Plan: Extubation in OR  Informed Consent: I have reviewed the patients History and Physical, chart, labs and discussed the procedure including the risks, benefits and alternatives for the proposed anesthesia with the patient or authorized representative who has indicated his/her understanding and acceptance.   Dental advisory given  Plan Discussed with: CRNA and Surgeon  Anesthesia Plan Comments:         Anesthesia Quick Evaluation  

## 2018-04-17 NOTE — Progress Notes (Signed)
Patient ID: Edward Guzman, male   DOB: July 04, 1946, 72 y.o.   MRN: [id number]  Post-op note  Subjective: The patient is doing well.  No complaints.  Objective: Vital signs in last 24 hours: Temp:  [98 F (36.7 C)-98.2 F (36.8 C)] 98.2 F (36.8 C) (10/14 1644) Pulse Rate:  [91-110] 107 (10/14 1644) Resp:  [11-21] 16 (10/14 1644) BP: (127-149)/(64-102) 130/79 (10/14 1644) SpO2:  [95 %-100 %] 95 % (10/14 1644) Weight:  [78.6 kg-80.1 kg] 80.1 kg (10/14 1644)  Intake/Output from previous day: No intake/output data recorded. Intake/Output this shift: Total I/O In: 3200 [I.V.:2000; IV Piggyback:1200] Out: 240 [Urine:150; Drains:40; Blood:50]  Physical Exam:  General: Alert and oriented. Abdomen: Soft, Nondistended. Incisions: Clean and dry. GU: Urine clear.  Lab Results: Recent Labs    04/17/18 1531  HGB 14.4  HCT 43.3    Assessment/Plan: POD#0   1) Continue to monitor, ambulate, IS   Pryor Curia. MD   LOS: 0 days   Scotlynn Noyes,LES 04/17/2018, 5:17 PM

## 2018-04-17 NOTE — Discharge Instructions (Signed)

## 2018-04-17 NOTE — Anesthesia Procedure Notes (Signed)
Procedure Name: Intubation Date/Time: 04/17/2018 11:35 AM Performed by: Glory Buff, CRNA Pre-anesthesia Checklist: Patient identified, Emergency Drugs available, Suction available and Patient being monitored Patient Re-evaluated:Patient Re-evaluated prior to induction Oxygen Delivery Method: Circle system utilized Preoxygenation: Pre-oxygenation with 100% oxygen Induction Type: IV induction Ventilation: Mask ventilation without difficulty Laryngoscope Size: Miller and 3 Grade View: Grade I Tube type: Oral Tube size: 7.5 mm Number of attempts: 1 Airway Equipment and Method: Stylet and Oral airway Placement Confirmation: ETT inserted through vocal cords under direct vision,  positive ETCO2 and breath sounds checked- equal and bilateral Secured at: 21 cm Tube secured with: Tape Dental Injury: Teeth and Oropharynx as per pre-operative assessment

## 2018-04-17 NOTE — Transfer of Care (Signed)
Immediate Anesthesia Transfer of Care Note  Patient: Edward Guzman  Procedure(s) Performed: XI ROBOTIC ASSISTED LAPAROSCOPIC RADICAL PROSTATECTOMY LEVEL 3 (N/A ) LYMPHADENECTOMY, PELVIC (Bilateral )  Patient Location: PACU  Anesthesia Type:General  Level of Consciousness: awake, alert  and oriented  Airway & Oxygen Therapy: Patient Spontanous Breathing and Patient connected to face mask oxygen  Post-op Assessment: Report given to RN and Post -op Vital signs reviewed and stable  Post vital signs: Reviewed and stable  Last Vitals:  Vitals Value Taken Time  BP 149/91 04/17/2018  3:12 PM  Temp    Pulse 101 04/17/2018  3:14 PM  Resp 14 04/17/2018  3:14 PM  SpO2 97 % 04/17/2018  3:14 PM  Vitals shown include unvalidated device data.  Last Pain:  Vitals:   04/17/18 0958  TempSrc: Oral  PainSc: 0-No pain         Complications: No apparent anesthesia complications

## 2018-04-17 NOTE — Anesthesia Postprocedure Evaluation (Signed)
Anesthesia Post Note  Patient: Edward Guzman  Procedure(s) Performed: XI ROBOTIC ASSISTED LAPAROSCOPIC RADICAL PROSTATECTOMY LEVEL 3 (N/A ) LYMPHADENECTOMY, PELVIC (Bilateral )     Patient location during evaluation: PACU Anesthesia Type: General Level of consciousness: awake and alert Pain management: pain level controlled Vital Signs Assessment: post-procedure vital signs reviewed and stable Respiratory status: spontaneous breathing, nonlabored ventilation, respiratory function stable and patient connected to nasal cannula oxygen Cardiovascular status: blood pressure returned to baseline and stable Postop Assessment: no apparent nausea or vomiting Anesthetic complications: no    Last Vitals:  Vitals:   04/17/18 0958 04/17/18 1512  BP: (!) 135/102   Pulse:    Resp:    Temp: 36.8 C (P) 36.7 C  SpO2:      Last Pain:  Vitals:   04/17/18 0958  TempSrc: Oral  PainSc: 0-No pain                 Aarohi Redditt S

## 2018-04-18 ENCOUNTER — Encounter (HOSPITAL_COMMUNITY): Payer: Self-pay | Admitting: Urology

## 2018-04-18 ENCOUNTER — Encounter: Payer: Self-pay | Admitting: Medical Oncology

## 2018-04-18 DIAGNOSIS — C61 Malignant neoplasm of prostate: Secondary | ICD-10-CM | POA: Diagnosis not present

## 2018-04-18 LAB — HEMOGLOBIN AND HEMATOCRIT, BLOOD
HEMATOCRIT: 37.5 % — AB (ref 39.0–52.0)
Hemoglobin: 12.8 g/dL — ABNORMAL LOW (ref 13.0–17.0)

## 2018-04-18 MED ORDER — HYDROCODONE-ACETAMINOPHEN 5-325 MG PO TABS
1.0000 | ORAL_TABLET | Freq: Once | ORAL | Status: AC
  Administered 2018-04-18: 1 via ORAL
  Filled 2018-04-18: qty 1

## 2018-04-18 MED ORDER — BISACODYL 10 MG RE SUPP
10.0000 mg | Freq: Once | RECTAL | Status: AC
  Administered 2018-04-18: 10 mg via RECTAL
  Filled 2018-04-18: qty 1

## 2018-04-18 MED ORDER — TRAMADOL HCL 50 MG PO TABS
50.0000 mg | ORAL_TABLET | Freq: Four times a day (QID) | ORAL | Status: DC | PRN
  Administered 2018-04-18: 100 mg via ORAL
  Filled 2018-04-18: qty 2

## 2018-04-18 NOTE — Discharge Summary (Signed)
  Date of admission: 04/17/2018  Date of discharge: 04/18/2018  Admission diagnosis: Prostate Cancer  Discharge diagnosis: Prostate Cancer  History and Physical: For full details, please see admission history and physical. Briefly, Edward Guzman is a 72 y.o. gentleman with localized prostate cancer.  After discussing management/treatment options, he elected to proceed with surgical treatment.  Hospital Course: Edward Guzman was taken to the operating room on 04/17/2018 and underwent a robotic assisted laparoscopic radical prostatectomy. He tolerated this procedure well and without complications. Postoperatively, he was able to be transferred to a regular hospital room following recovery from anesthesia.  He was able to begin ambulating the night of surgery. He remained hemodynamically stable overnight.  He had excellent urine output with appropriately minimal output from his pelvic drain and his pelvic drain was removed on POD #1.  He was transitioned to oral pain medication, tolerated a clear liquid diet, and had met all discharge criteria and was able to be discharged home later on POD#1.  Laboratory values:  Recent Labs    04/17/18 1531 04/18/18 0443  HGB 14.4 12.8*  HCT 43.3 37.5*    Disposition: Home  Discharge instruction: He was instructed to be ambulatory but to refrain from heavy lifting, strenuous activity, or driving. He was instructed on urethral catheter care.  Discharge medications:   Allergies as of 04/18/2018      Reactions   Prochlorperazine Edisylate Anaphylaxis, Swelling   Compazine   Gastrografin [diatrizoate Meglumine & Sodium] Hives   Contrast dye   Other Hives      Medication List    STOP taking these medications   Fish Oil 1000 MG Caps   milk thistle 175 MG tablet   tamsulosin 0.4 MG Caps capsule Commonly known as:  FLOMAX     TAKE these medications   ezetimibe-simvastatin 10-40 MG tablet Commonly known as:  VYTORIN Take 1 tablet by mouth  daily.   omeprazole 20 MG capsule Commonly known as:  PRILOSEC Take 20 mg by mouth daily.   sulfamethoxazole-trimethoprim 800-160 MG tablet Commonly known as:  BACTRIM DS,SEPTRA DS Take 1 tablet by mouth 2 (two) times daily. Start the day prior to foley removal appointment   traMADol 50 MG tablet Commonly known as:  ULTRAM Take 1-2 tablets (50-100 mg total) by mouth every 6 (six) hours as needed for moderate pain or severe pain.       Followup: He will followup in 1 week for catheter removal and to discuss his surgical pathology results.

## 2018-04-18 NOTE — Progress Notes (Signed)
Edward Guzman states he is doing well post robotic prostatectomy. He is for discharge later today. We discussed the importance of walking and deep breathing. He will follow up with Dr. Alinda Money 10/22 for catheter removal and to discuss pathology. I asked him to call me with questions or concerns. He voiced his appreciation of the [facility] and the support the has received thru this journey.
